# Patient Record
Sex: Male | Born: 1935 | Race: White | Hispanic: No | Marital: Married | Smoking: Former smoker
Health system: Southern US, Community
[De-identification: ages and names within clinical notes are randomized; demographics above are authoritative.]

## PROBLEM LIST (undated history)

## (undated) DIAGNOSIS — M1990 Unspecified osteoarthritis, unspecified site: Secondary | ICD-10-CM

## (undated) DIAGNOSIS — C801 Malignant (primary) neoplasm, unspecified: Secondary | ICD-10-CM

## (undated) DIAGNOSIS — F329 Major depressive disorder, single episode, unspecified: Secondary | ICD-10-CM

## (undated) DIAGNOSIS — D689 Coagulation defect, unspecified: Secondary | ICD-10-CM

## (undated) DIAGNOSIS — F32A Depression, unspecified: Secondary | ICD-10-CM

## (undated) HISTORY — DX: Unspecified osteoarthritis, unspecified site: M19.90

## (undated) HISTORY — PX: SPINE SURGERY: SHX786

## (undated) HISTORY — DX: Coagulation defect, unspecified: D68.9

## (undated) HISTORY — DX: Malignant (primary) neoplasm, unspecified: C80.1

## (undated) HISTORY — PX: PROSTATE SURGERY: SHX751

## (undated) HISTORY — DX: Major depressive disorder, single episode, unspecified: F32.9

## (undated) HISTORY — DX: Depression, unspecified: F32.A

---

## 1999-05-28 ENCOUNTER — Emergency Department (HOSPITAL_COMMUNITY): Admission: EM | Admit: 1999-05-28 | Discharge: 1999-05-28 | Payer: Self-pay | Admitting: Emergency Medicine

## 1999-05-28 ENCOUNTER — Encounter: Payer: Self-pay | Admitting: Emergency Medicine

## 2001-01-09 ENCOUNTER — Encounter: Payer: Self-pay | Admitting: Orthopedic Surgery

## 2001-01-13 ENCOUNTER — Inpatient Hospital Stay (HOSPITAL_COMMUNITY): Admission: RE | Admit: 2001-01-13 | Discharge: 2001-01-17 | Payer: Self-pay | Admitting: Orthopedic Surgery

## 2001-01-13 ENCOUNTER — Encounter: Payer: Self-pay | Admitting: Orthopedic Surgery

## 2001-03-07 ENCOUNTER — Encounter: Admission: RE | Admit: 2001-03-07 | Discharge: 2001-04-10 | Payer: Self-pay | Admitting: Orthopedic Surgery

## 2002-08-10 ENCOUNTER — Encounter: Payer: Self-pay | Admitting: Orthopedic Surgery

## 2002-08-10 ENCOUNTER — Encounter: Admission: RE | Admit: 2002-08-10 | Discharge: 2002-08-10 | Payer: Self-pay | Admitting: Orthopedic Surgery

## 2002-12-01 ENCOUNTER — Ambulatory Visit (HOSPITAL_COMMUNITY): Admission: RE | Admit: 2002-12-01 | Discharge: 2002-12-01 | Payer: Self-pay | Admitting: Gastroenterology

## 2004-03-01 ENCOUNTER — Ambulatory Visit (HOSPITAL_COMMUNITY): Admission: RE | Admit: 2004-03-01 | Discharge: 2004-03-01 | Payer: Self-pay

## 2004-04-11 ENCOUNTER — Encounter: Admission: RE | Admit: 2004-04-11 | Discharge: 2004-04-11 | Payer: Self-pay

## 2004-04-26 ENCOUNTER — Encounter: Admission: RE | Admit: 2004-04-26 | Discharge: 2004-04-26 | Payer: Self-pay

## 2004-05-17 ENCOUNTER — Encounter: Admission: RE | Admit: 2004-05-17 | Discharge: 2004-05-17 | Payer: Self-pay

## 2004-06-13 ENCOUNTER — Encounter: Admission: RE | Admit: 2004-06-13 | Discharge: 2004-06-13 | Payer: Self-pay

## 2004-06-15 ENCOUNTER — Encounter (HOSPITAL_COMMUNITY): Admission: RE | Admit: 2004-06-15 | Discharge: 2004-09-13 | Payer: Self-pay | Admitting: Urology

## 2004-07-06 ENCOUNTER — Ambulatory Visit (HOSPITAL_COMMUNITY): Admission: RE | Admit: 2004-07-06 | Discharge: 2004-07-06 | Payer: Self-pay | Admitting: Urology

## 2004-07-10 ENCOUNTER — Ambulatory Visit: Admission: RE | Admit: 2004-07-10 | Discharge: 2004-08-09 | Payer: Self-pay | Admitting: Radiation Oncology

## 2004-09-07 ENCOUNTER — Inpatient Hospital Stay (HOSPITAL_COMMUNITY): Admission: RE | Admit: 2004-09-07 | Discharge: 2004-09-08 | Payer: Self-pay | Admitting: Neurosurgery

## 2007-01-23 ENCOUNTER — Encounter: Admission: RE | Admit: 2007-01-23 | Discharge: 2007-01-23 | Payer: Self-pay | Admitting: Internal Medicine

## 2008-01-23 ENCOUNTER — Ambulatory Visit: Payer: Self-pay | Admitting: Surgery

## 2008-01-23 ENCOUNTER — Inpatient Hospital Stay (HOSPITAL_COMMUNITY): Admission: EM | Admit: 2008-01-23 | Discharge: 2008-01-29 | Payer: Self-pay | Admitting: Emergency Medicine

## 2008-01-23 ENCOUNTER — Encounter (INDEPENDENT_AMBULATORY_CARE_PROVIDER_SITE_OTHER): Payer: Self-pay | Admitting: Emergency Medicine

## 2008-01-26 ENCOUNTER — Encounter (INDEPENDENT_AMBULATORY_CARE_PROVIDER_SITE_OTHER): Payer: Self-pay | Admitting: Internal Medicine

## 2008-04-05 ENCOUNTER — Encounter: Admission: RE | Admit: 2008-04-05 | Discharge: 2008-04-05 | Payer: Self-pay | Admitting: Internal Medicine

## 2008-07-15 DIAGNOSIS — M069 Rheumatoid arthritis, unspecified: Secondary | ICD-10-CM | POA: Insufficient documentation

## 2008-07-15 DIAGNOSIS — I2699 Other pulmonary embolism without acute cor pulmonale: Secondary | ICD-10-CM

## 2008-07-15 DIAGNOSIS — F329 Major depressive disorder, single episode, unspecified: Secondary | ICD-10-CM

## 2008-07-15 DIAGNOSIS — I82409 Acute embolism and thrombosis of unspecified deep veins of unspecified lower extremity: Secondary | ICD-10-CM | POA: Insufficient documentation

## 2008-07-15 DIAGNOSIS — F3289 Other specified depressive episodes: Secondary | ICD-10-CM | POA: Insufficient documentation

## 2008-07-19 ENCOUNTER — Ambulatory Visit: Payer: Self-pay | Admitting: Cardiology

## 2008-07-19 DIAGNOSIS — R5381 Other malaise: Secondary | ICD-10-CM

## 2008-07-19 DIAGNOSIS — R42 Dizziness and giddiness: Secondary | ICD-10-CM

## 2008-07-19 DIAGNOSIS — R5383 Other fatigue: Secondary | ICD-10-CM

## 2008-07-19 DIAGNOSIS — I498 Other specified cardiac arrhythmias: Secondary | ICD-10-CM

## 2008-07-19 DIAGNOSIS — R0602 Shortness of breath: Secondary | ICD-10-CM | POA: Insufficient documentation

## 2008-07-22 ENCOUNTER — Telehealth: Payer: Self-pay | Admitting: Cardiology

## 2008-07-26 ENCOUNTER — Encounter: Payer: Self-pay | Admitting: Cardiology

## 2008-07-26 ENCOUNTER — Ambulatory Visit: Payer: Self-pay | Admitting: Cardiology

## 2008-07-26 ENCOUNTER — Ambulatory Visit: Payer: Self-pay

## 2008-07-28 ENCOUNTER — Encounter: Payer: Self-pay | Admitting: Cardiology

## 2008-08-09 ENCOUNTER — Telehealth: Payer: Self-pay | Admitting: Cardiology

## 2009-01-09 ENCOUNTER — Inpatient Hospital Stay (HOSPITAL_COMMUNITY): Admission: EM | Admit: 2009-01-09 | Discharge: 2009-01-13 | Payer: Self-pay | Admitting: Emergency Medicine

## 2009-01-09 ENCOUNTER — Ambulatory Visit: Payer: Self-pay | Admitting: Cardiology

## 2009-01-09 ENCOUNTER — Ambulatory Visit: Payer: Self-pay | Admitting: Internal Medicine

## 2009-01-10 ENCOUNTER — Encounter (INDEPENDENT_AMBULATORY_CARE_PROVIDER_SITE_OTHER): Payer: Self-pay | Admitting: Internal Medicine

## 2009-01-10 ENCOUNTER — Ambulatory Visit: Payer: Self-pay | Admitting: Vascular Surgery

## 2009-01-10 ENCOUNTER — Encounter: Payer: Self-pay | Admitting: Cardiology

## 2009-01-11 ENCOUNTER — Encounter (INDEPENDENT_AMBULATORY_CARE_PROVIDER_SITE_OTHER): Payer: Self-pay | Admitting: Internal Medicine

## 2009-01-27 ENCOUNTER — Ambulatory Visit: Payer: Self-pay | Admitting: Internal Medicine

## 2009-02-07 ENCOUNTER — Telehealth (INDEPENDENT_AMBULATORY_CARE_PROVIDER_SITE_OTHER): Payer: Self-pay | Admitting: *Deleted

## 2009-02-09 ENCOUNTER — Encounter: Payer: Self-pay | Admitting: Cardiology

## 2010-06-29 LAB — CBC
HCT: 34.5 % — ABNORMAL LOW (ref 39.0–52.0)
HCT: 35 % — ABNORMAL LOW (ref 39.0–52.0)
Hemoglobin: 10.6 g/dL — ABNORMAL LOW (ref 13.0–17.0)
Hemoglobin: 11.7 g/dL — ABNORMAL LOW (ref 13.0–17.0)
Hemoglobin: 12 g/dL — ABNORMAL LOW (ref 13.0–17.0)
MCHC: 34.2 g/dL (ref 30.0–36.0)
MCV: 95.2 fL (ref 78.0–100.0)
Platelets: 183 10*3/uL (ref 150–400)
RBC: 3.59 MIL/uL — ABNORMAL LOW (ref 4.22–5.81)
RBC: 3.67 MIL/uL — ABNORMAL LOW (ref 4.22–5.81)
RDW: 16.7 % — ABNORMAL HIGH (ref 11.5–15.5)
WBC: 9.6 10*3/uL (ref 4.0–10.5)

## 2010-06-29 LAB — GLUCOSE, CAPILLARY
Glucose-Capillary: 128 mg/dL — ABNORMAL HIGH (ref 70–99)
Glucose-Capillary: 82 mg/dL (ref 70–99)
Glucose-Capillary: 93 mg/dL (ref 70–99)

## 2010-06-29 LAB — BLOOD GAS, ARTERIAL
Drawn by: 287601
FIO2: 0.21 %
Patient temperature: 98.6
TCO2: 17.5 mmol/L (ref 0–100)
pCO2 arterial: 14.6 mmHg — CL (ref 35.0–45.0)
pCO2 arterial: 24.1 mmHg — ABNORMAL LOW (ref 35.0–45.0)
pH, Arterial: 7.457 — ABNORMAL HIGH (ref 7.350–7.450)
pH, Arterial: 7.63 (ref 7.350–7.450)

## 2010-06-29 LAB — POCT I-STAT, CHEM 8
BUN: 14 mg/dL (ref 6–23)
Calcium, Ion: 1.04 mmol/L — ABNORMAL LOW (ref 1.12–1.32)
Chloride: 110 mEq/L (ref 96–112)

## 2010-06-29 LAB — BASIC METABOLIC PANEL
CO2: 21 mEq/L (ref 19–32)
CO2: 23 mEq/L (ref 19–32)
Calcium: 8.5 mg/dL (ref 8.4–10.5)
Calcium: 8.8 mg/dL (ref 8.4–10.5)
Chloride: 112 mEq/L (ref 96–112)
Creatinine, Ser: 1.35 mg/dL (ref 0.4–1.5)
GFR calc Af Amer: >60 mL/min (ref >60)
GFR calc non Af Amer: 57 mL/min — ABNORMAL LOW (ref >60)
Glucose, Bld: 90 mg/dL (ref 70–99)
Potassium: 3.9 mEq/L (ref 3.5–5.1)
Sodium: 139 mEq/L (ref 135–145)
Sodium: 141 mEq/L (ref 135–145)

## 2010-06-29 LAB — POCT I-STAT 3, VENOUS BLOOD GAS (G3P V)
Bicarbonate: 18 mEq/L — ABNORMAL LOW (ref 20.0–24.0)
TCO2: 18 mmol/L (ref 0–100)
pH, Ven: 7.646 (ref 7.250–7.300)

## 2010-06-29 LAB — POCT CARDIAC MARKERS
CKMB, poc: 2.3 ng/mL (ref 1.0–8.0)
Troponin i, poc: <0.05 ng/mL (ref 0.00–0.09)

## 2010-06-29 LAB — COMPREHENSIVE METABOLIC PANEL
ALT: 11 U/L (ref 0–53)
Albumin: 2.8 g/dL — ABNORMAL LOW (ref 3.5–5.2)
Alkaline Phosphatase: 47 U/L (ref 39–117)
Calcium: 8.8 mg/dL (ref 8.4–10.5)
GFR calc Af Amer: >60 mL/min (ref >60)
Glucose, Bld: 96 mg/dL (ref 70–99)
Potassium: 3.8 mEq/L (ref 3.5–5.1)
Sodium: 141 mEq/L (ref 135–145)
Total Protein: 5.2 g/dL — ABNORMAL LOW (ref 6.0–8.3)

## 2010-06-29 LAB — DIFFERENTIAL
Eosinophils Absolute: 0.2 10*3/uL (ref 0.0–0.7)
Eosinophils Relative: 2 % (ref 0–5)
Lymphs Abs: 1.7 10*3/uL (ref 0.7–4.0)
Monocytes Absolute: 0.9 10*3/uL (ref 0.1–1.0)
Monocytes Relative: 9 % (ref 3–12)
Neutrophils Relative %: 71 % (ref 43–77)

## 2010-06-29 LAB — IRON AND TIBC
Saturation Ratios: 15 % — ABNORMAL LOW (ref 20–55)
TIBC: 276 ug/dL (ref 215–435)

## 2010-06-29 LAB — HEPARIN LEVEL (UNFRACTIONATED): Heparin Unfractionated: <0.1 IU/mL — ABNORMAL LOW (ref 0.30–0.70)

## 2010-06-29 LAB — CARDIAC PANEL(CRET KIN+CKTOT+MB+TROPI)
CK, MB: 3 ng/mL (ref 0.3–4.0)
CK, MB: 3.4 ng/mL (ref 0.3–4.0)
Relative Index: 2.7 — ABNORMAL HIGH (ref 0.0–2.5)
Relative Index: 2.8 — ABNORMAL HIGH (ref 0.0–2.5)
Relative Index: 2.9 — ABNORMAL HIGH (ref 0.0–2.5)
Total CK: 104 U/L (ref 7–232)
Total CK: 105 U/L (ref 7–232)
Troponin I: 0.01 ng/mL (ref 0.00–0.06)
Troponin I: 0.02 ng/mL (ref 0.00–0.06)
Troponin I: 0.02 ng/mL (ref 0.00–0.06)

## 2010-06-29 LAB — POCT I-STAT 3, ART BLOOD GAS (G3+)
O2 Saturation: 99 %
pCO2 arterial: 14.1 mmHg — CL (ref 35.0–45.0)
pCO2 arterial: 14.2 mmHg — CL (ref 35.0–45.0)
pH, Arterial: 7.607 (ref 7.350–7.450)
pO2, Arterial: 116 mmHg — ABNORMAL HIGH (ref 80.0–100.0)
pO2, Arterial: 119 mmHg — ABNORMAL HIGH (ref 80.0–100.0)

## 2010-06-29 LAB — PROTIME-INR
INR: 2.31 — ABNORMAL HIGH (ref 0.00–1.49)
INR: 2.33 — ABNORMAL HIGH (ref 0.00–1.49)
INR: 2.81 — ABNORMAL HIGH (ref 0.00–1.49)
Prothrombin Time: 29.4 seconds — ABNORMAL HIGH (ref 11.6–15.2)

## 2010-06-29 LAB — FERRITIN: Ferritin: 23 ng/mL (ref 22–322)

## 2010-06-29 LAB — FOLATE: Folate: >20 ng/mL

## 2010-07-24 ENCOUNTER — Other Ambulatory Visit: Payer: Self-pay | Admitting: Dermatology

## 2010-08-07 ENCOUNTER — Other Ambulatory Visit: Payer: Self-pay | Admitting: Dermatology

## 2010-08-08 NOTE — Consult Note (Signed)
NAME:  Miguel [name], Miguel [name] NO.:  [id number]   MEDICAL RECORD NO.:  [id number]          PATIENT TYPE:  INP   LOCATION:  2025                         FACILITY:  [facility]   PHYSICIAN:  Antonietta Breach, M.D.  DATE OF BIRTH:  Nov 10, 1931   DATE OF CONSULTATION:  01/28/2008  DATE OF DISCHARGE:                                 CONSULTATION   REQUESTING PHYSICIAN:  [employer].   REASON FOR CONSULTATION:  Severe anxiety and depression.   HISTORY OF PRESENT ILLNESS:  Miguel [name] is a 77 year old male  admitted to the [facility] on January 06, 2008, due to pulmonary  embolism.   Miguel [name] reentered a major depression episode starting about 2  months ago.  He recalls that he was mowing the lawn, began to have  shortness of breath and began to have catastrophic thinking about his  likelihood of suddenly becoming physically disabled.  Followed by this,  he began to have very depressed mood and then redeveloped low energy,  poor concentration, anhedonia and thoughts of hopelessness.  As  mentioned, this has been occurring for several weeks.  He has not been  having any suicidal thoughts.  He has no hallucinations or delusions.   He does continue with regular catastrophic thoughts about the future  with severe excessive worry, feeling on edge and muscle tension.   His general medical care has not resulted in any reduction of these  symptoms.   He has been switching psychotropic medications with his psychiatrist.  Most recently, they started Parnate; however, the Parnate was  discontinued by his psychiatrist when he entered the hospital to reduce  the risk of hypertensive crisis given the possibility of dietary factors  and other factors.   PAST PSYCHIATRIC HISTORY:  Miguel [name] does have an extensive past  psychiatric history.   In review of the past medical record at [facility], only Xanax is  mentioned.  This was in 2002, for anxiety.   Taking the  history from the patient who is a reliable historian along  with his wife (the patient requests that his wife remain in the room to  help with history, facilitation of support and education).   Miguel [name] has undergone at least two courses of electroconvulsive  therapy.  The first one was in 1998.  He also had another one 7 years  ago with 13 treatments.  He has been under the care of Dr. Jennelle Human.   FAMILY PSYCHIATRIC HISTORY:  None known.   SOCIAL HISTORY:  Miguel [name] is retired.  He lives with his  supportive wife.  He does not do alcohol or illegal drugs.   PAST MEDICAL HISTORY:  1. Rheumatoid arthritis.  2. History of prostate cancer with resection.  3. Left hip replacement.   MEDICATIONS:  The MAR is reviewed;  1. Folic acid 1 mg daily.  2. Multivitamin daily.  3. Xanax 0.25-0.75 mg p.o. q.6 h., p.r.n.   ALLERGIES:  IBUPROFEN.   LABORATORY DATA:  Sodium 142, BUN 10, creatinine 1.31, glucose 102, WBC  6.1, hemoglobin 9.7, platelet count 196, INR 2.6, TSH 1.448.   REVIEW  OF SYSTEMS:  PSYCHIATRIC:  Miguel [name] has received Xanax 0.5  and another dosage of Xanax 0.25 over the past 24 hours.   He has been tried on multiple psychotropics in the past.  Lithium has  been tried.  He and his wife do not recall T3.   Miguel [name] does not have any history of decreased need for sleep or  increased energy.  He has no history of suicide attempts.  He has  undergone multiple psychiatric hospitalizations.   He has had a trial on Nardil.  His wife states there was never a  hypertensive crisis with it; however, the patient states that he had one  in a restaurant one time.  The Nardil did work; it was discontinued for  an unknown reason.   He does have a history of Zyprexa trial.  Cymbalta was associated with  efficacy for 3 months, and then he had the recurrence of depression  mentioned in the history of present illness.   Cymbalta with Remeron has been tried.  The  Remeron was only increased to  7.5 mg nightly due to sedation.  He also has been tried on Abilify with  Cymbalta.  They are not sure if he has been on a combination of Effexor  with Remeron.   They do not recall Lamictal or a stimulation agent such as Dexedrine or  Ritalin.  They do not recall any episode of utilizing an SSRI, such as  Celexa at 75-100% of the maximum daily dosage for at least 16 weeks.  They are not sure if he has been on Seroquel.  They do recall a history  of Wellbutrin.  They are not sure if it is has been combined with other  medications.   The patient has an extreme resistance toward repeating electroconvulsive  therapy.   He has no history of suicidal ideation, homicidal ideation or  hallucinations.   Constitutional, head, eyes, ears, nose, throat, mouth, neurologic,  cardiovascular, respiratory, gastrointestinal, genitourinary, skin,  musculoskeletal, hematologic, lymphatic, endocrine and metabolic all  unremarkable.   PHYSICAL EXAMINATION:  VITAL SIGNS:  Temperature 98.0, pulse 54,  respiratory rate 20, blood pressure 110/69.  GENERAL APPEARANCE:  Miguel [name] is as an elderly male lying in a  supine position in his hospital bed with no abnormal involuntary  movements.   MENTAL STATUS EXAM:  Miguel [name] demonstrates intermittent eye  contact, preferring to close his eyes.  His attention span is slightly  decreased.  Concentration is moderately decreased.  Affect is very  anxious, mood is anxious.  Orientation is completely intact to all  spheres.  Memory is intact to immediate, recent and remote.  Fund of  knowledge and intelligence are within normal limits.  Speech involves a  slightly flat prosody, no dysarthria.  Thought process is logical,  coherent and goal-directed.  No looseness of associations.  Thought  content; no thoughts of harming himself, no thoughts of harming others,  no delusions, no hallucinations.  Insight is partially intact.   Judgment  is intact.   ASSESSMENT:  AXIS I:  1. 293.83, mood disorder, not otherwise specified, likely 296.33,      major depressive disorder, recurrent, severe.  2. 293.84, anxiety disorder, not otherwise specified.  There are some      obsessive features; however, this does appear to be generalized      anxiety disorder with severe catastrophic worry.  The patient and      his wife believe that his anxiety has  been primary in the sense      that his last episode seemed to start with catastrophic thinking      about his somatic symptoms and the future, followed by progression      of depression.  AXIS II:  Deferred.  AXIS III:  See past medical history.  AXIS IV:  General medical.  AXIS V:  Global Assessment of Functioning 55.   Miguel [name] is not at risk to harm himself or others.  He does agree  to call emergency services immediately for any thoughts of harming  himself, thoughts of harming others or distress.   The undersigned provided ego supportive psychotherapy and education with  both the patient and his wife.   The undersigned will contact Dr. Jennelle Human regarding previous psychotropic  trials and the possibilities of future trials.   The indications, alternatives and adverse effects of anti-acute anxiety  agents were reviewed.  They understand and want to proceed with Xanax  for now.   Would consider a standing dose of Xanax 0.5 mg t.i.d. due to the  patient's intermittent use requiring p.r.n.'s with the pattern of taking  a p.r.n. and a calming of his anxiety, followed by a re-exacerbation of  his anxiety in between the dosages.  It is possible for him to shape a  pattern of aberrant conditioning with the Xanax that can promote an  addictive pattern.   Therefore, when it is demonstrated that he require steady Xanax, it is  more optimal to place him on a low standing dose until other tools can  become effective with the taper off of Xanax following.   Other tools  for anti-anxiety specifically can involve a titration of  Celexa to 60 mg daily for [redacted] weeks along with cognitive behavioral  therapy, combined with deep breathing and progressive muscle relaxation.  Both the patient and his wife do not recall any cognitive behavioral  therapy.   Specifically for the anti-depression component, combinations of Lamictal  with other primary antidepressants can be considered   Retrying Effexor combined with Remeron can be considered combined with  Lamictal.   Other possibilities include an acute stimulant; however, this might  exacerbate the anxiety component.   When utilizing the Xanax acutely, would monitor for excessive sedation  and potentially imbalance.      Antonietta Breach, M.D.  Electronically Signed     JW/MEDQ  D:  01/29/2008  T:  01/29/2008  Job:  [id number]

## 2010-08-08 NOTE — Discharge Summary (Signed)
NAME:  Miguel [name], Miguel [name] NO.:  [id number]   MEDICAL RECORD NO.:  [id number]          PATIENT TYPE:  INP   LOCATION:  2025                         FACILITY:  [facility]   PHYSICIAN:  Monte Fantasia, MD  DATE OF BIRTH:  09/30/1931   DATE OF ADMISSION:  01/23/2008  DATE OF DISCHARGE:  01/29/2008                               DISCHARGE SUMMARY   ADDENDUM   DISCHARGE DIAGNOSES:  1. Bilateral pulmonary embolism.  2. Left leg deep venous thrombosis.  3. Depression.  4. Rheumatoid arthritis.   DISCHARGE MEDICATIONS:  1. Prednisone 2.5 mg p.o. daily.  2. Folic acid 1 mg p.o. daily.  3. Methotrexate 10 p.o. every Friday at 6 p.m.  4. Protonix 40 mg p.o. q.12 hours.  5. Os-Cal 1 tablet p.o. twice daily.  6. Multivitamin 1 tablet p.o. daily.  7. Alprazolam 0.75 mg p.o. q.6 hours p.r.n. anxiety.  8. Vitamin D 2000 units daily.  9. Coumadin 3 mg p.o. nightly.  10.Ensure 237 mL p.o. t.i.d.  11.Senna 1 tablet p.o. nightly.   PHYSICAL EXAMINATION:  VITAL SIGNS:  Temperature 99.8, pulse of 53,  respirations 18, blood pressure 129/59, and oxygen saturations 96% on  room air.  HEENT:  Neck is supple.  Pupils equal and reacting to light.  No pallor.  No lymphadenopathy.  RESPIRATORY:  Air entry is bilaterally equal.  No rales.  No rhonchi.  CARDIOVASCULAR:  S1 and S2 normal.  Regular rate and rhythm.  ABDOMEN:  Soft, no organomegaly and no distention.  No tenderness.  No  guarding.  No rigidity.  EXTREMITIES:  No edema of feet.  CNS:  The patient is alert, awake, oriented x3.  No focal neurological  deficits.   LABORATORY DATA:  WBC 5.2, hemoglobin 9.5, hematocrit 26.9, and  platelets 231.  Prothrombin time is 28.6, INR is 2.5, sodium 141,  potassium 3.9, chloride 112, bicarb 22, glucose is 93, BUN is 8,  creatinine 1.15, calcium is 8.6, TSH 1.448.   ASSESSMENT AND PLAN:  To discharge the patient on Coumadin 3 mg p.o.  nightly.  The patient visiting nurses, arranged for  daily INR checks and  to inform the INR values to the primary care physician.  Home care  services have been set up as per discussions with the home care.  We  will continue the medications as per dictated above.  The patient needs  to follow up with the primary care physician Dr. Georgianne Fick and  psychiatrist Dr. Meredith Staggers in view of his depression and further issues.  To follow up with  the primary care physician within a week.  Monitor INR daily and to  inform the INR values to the primary care physician.   DVT and GI prophylaxis given.      Monte Fantasia, MD  Electronically Signed     MP/MEDQ  D:  01/29/2008  T:  01/30/2008  Job:  [id number]

## 2010-08-08 NOTE — H&P (Signed)
NAME:  Miguel [name], Miguel [name] NO.:  [id number]   MEDICAL RECORD NO.:  [id number]          PATIENT TYPE:  INP   LOCATION:  2025                         FACILITY:  [facility]   PHYSICIAN:  Estelle Grumbles, MDDATE OF BIRTH:  [birth year]   DATE OF ADMISSION:  01/23/2008  DATE OF DISCHARGE:                              HISTORY & PHYSICAL   PRIMARY CARE PHYSICIAN:  Georgianne Fick, M.D.   PSYCHIATRIST:  Meredith Staggers, M.D.   CHIEF COMPLAINT:  Chest pain.   HISTORY OF PRESENT ILLNESS:  Miguel [name] is 77 year old male  presented to the emergency room with left-sided chest pain.  The patient  states he woke up yesterday morning with left lower rib cage chest pain  worse with deep breaths.  The pain was persistent throughout the day,  worse with deep inspiration.  He took a pain medication last night with  some relief.  Last night, he woke up with chest pain.  He was also  having some dyspnea.  As the pain was persistent he decided to come to  the emergency room.  In the ED, the patient underwent CT of the chest  which revealed bilateral pulmonary embolism.  The patient states now the  chest pain is little better, at worse the pain was 10/10.  Currently, he  would say like 3-4/10.  Denies any nausea, vomiting, palpitations, or  diaphoresis.  Denies having any prior episodes of blood loss.  Denies  having any recent fever.   REVIEW OF SYSTEMS:  The patient denies having any dysphagia, dysarthria.  No visual or auditory symptoms.  Denies having any neck pain.  He does  have chest pain as mentioned earlier.  Denies having any muscle spasms.  Initially, he thought the chest pain is muscular.  Denies having any  orthopnea or paroxysmal nocturnal dyspnea.  He has been feeling  depressed.  In the recent past, he was following with a psychiatrist  regularly and his medications has been getting changed.  Recently, he  was started on Parnate MAO inhibitor.  He started taking that  medicine 3  days ago.  The patient denies having any nausea, vomiting, diarrhea, or  constipation.  No change in his bowel habits in the recent past.  He  denies having any hematemesis, melena, or hemoptysis.  He does have  lower back pain, which is chronic.  He denies having any lower extremity  swelling, tingling, numbness, or calf pain.  He denies having any  hematuria.  He denies having any dysuria, frequency, or urgency.  Other  review of systems are negative.   PAST MEDICAL HISTORY:  1. Depression.  2. Rheumatoid arthritis.   SURGICAL HISTORY:  1. He had history of prostate cancer and had prostatectomy done.  2. He had bilateral hip replacement and he had a spinal procedure done      for his chronic low back pain.   SOCIAL HISTORY:  He denies smoking, alcohol, or drug use.  He used to  smoke approximately 1 pack of cigarettes a day.  He quit smoking  approximately 40 years ago.  Lives with his wife.  Denies  having any  recent falls.   FAMILY HISTORY:  History of cancer runs in the family.  His sister has  thyroid cancer.   HOME MEDICATIONS:  1. He was taking prednisone 2.5 mg daily.  2. Folic acid 1 mg daily.  3. Calcium twice a day.  4. Multivitamin 1 tablet daily.  5. Vitamin D 2000 International Units once a day.  6. Alprazolam 0.75 mg as needed.  7. Methotrexate 2.5 mg.   ALLERGIES:  DAYPRO and IBUPROFEN.   PHYSICAL EXAMINATION:  GENERAL:  The patient is alert, awake, oriented  x4 not in any acute distress.  VITAL SIGNS:  Temperature 97.1, blood pressure 103/54, respirations 16,  O2 saturation 100% on oxygen.  HEENT:  Head, normocephalic and atraumatic.  Eyes; pupils equal,  reacting to light and accommodation.  No icterus was noted.  Oral  cavity, moist oral mucosa.  NECK:  Supple.  No JVD was noted.  CHEST:  Bilateral fair air entry.  No crackles or rales.  Does have some  tenderness in the left lower rib cage area.  HEART:  S1 and S2.  Regular rate and  rhythm.  No murmurs were noted.  ABDOMEN:  Soft.  Bowel sounds are positive.  Nontender.  Nondistended.  No guarding or rigidity.  EXTREMITIES:  No edema.  No calf tenderness.  Good peripheral pulses.  CNS:  No focal, motor, or neuro deficits.   LABORATORY DATA:  Sodium 138, potassium 3.9, chloride 107, bicarb 21,  BUN 20, creatinine 1.4, glucose 92, and calcium level 1.29.  CK 136 and  MB fraction 1.7.  Troponin less than 0.05.  INR 1.1.  White count 9.9,  hemoglobin 10.7, hematocrit 31.7, and platelets 176.  Liver function  tests are essentially normal except albumin level 3.1, which is lower  than normal.  BNP is 77.  Chest x-ray, low lung volumes.  No acute  process.  CT of the chest reveals bilateral pulmonary embolism.   IMPRESSION:  Acute pulmonary embolism resulting pleuritic chest pain.  We will admit the patient to the Telemetry Unit.  We will start IV  heparin drip and p.o. Coumadin.  Coumadin dose to be adjusted by the  Pharmacy.  We will follow up the PT and INR daily.  We will obtain the  CBC in the morning.  Parnate was discontinued as per the patient's  psychiatrist recommendation.  We will continue his other home  medications.  The patient's hemoglobin and hematocrit needed to be  followed closely and his mental status also has to be followed closely.  The patient was recently diagnosed with severe depression.  We will  obtain the pulse ox at room air.  We will also obtain the lower  extremities Doppler to rule out DVT.      Estelle Grumbles, MD  Electronically Signed     TP/MEDQ  D:  01/23/2008  T:  01/24/2008  Job:  [id number]   cc:   Georgianne Fick, M.D.  Jetty Duhamel., M.D.

## 2010-08-08 NOTE — Discharge Summary (Signed)
NAME:  Miguel [name], Miguel [name] NO.:  [id number]   MEDICAL RECORD NO.:  [id number]          PATIENT TYPE:  INP   LOCATION:  2025                         FACILITY:  [facility]   PHYSICIAN:  Monte Fantasia, MD  DATE OF BIRTH:  January 10, 1932   DATE OF ADMISSION:  01/23/2008  DATE OF DISCHARGE:                               DISCHARGE SUMMARY   A 77 year old Caucasian male patient was admitted on January 23, 2008,  with bilateral pulmonary embolism.  The patient was initially started on  heparin drip and then started on Coumadin p.o.  The patient's present  INR has improved to currently 2.5.  The patient is on 3 mg of Coumadin  everyday.  The patient denies of any complaints of chest pain or  shortness of breath, basically has improved from his baseline pain.   DISCHARGE MEDICATIONS:  1. Prednisone 2.5 mg p.o. daily.  2. Folic acid 1 mg p.o. daily.  3. Methotrexate 10 mg p.o. Friday at 6 p.m.  4. Protonix 40 mg p.o. q.12 h.  5. Os-Cal 1 tablet twice daily.  6. Multivitamin 1 tablet daily.  7. Alprazolam 0.75 mg q.6 h. p.r.n. anxiety.  8. Vitamin D 2000 IU daily.  9. Coumadin 3 mg p.o. at bedtime.  10.Ensure 237 mL p.o. t.i.d.  11.Senna 1 tab p.o. at bedtime.   PHYSICAL EXAMINATION:  VITAL SIGNS:  Temperature 97, pulse 71,  respirations 18, blood pressure 94/57, oxygen saturations 97% on room  air.   LABORATORY DATA:  Total WBC 6.1, H and H 9.7 over 28, hematocrit of  28.9, platelet 196.  Prothrombin time 29.6, INR is 2.6.  Sodium 142,  potassium 4.7, chloride 109, bicarb is 26, glucose is 102, BUN is 10,  creatinine is 1.3, calcium is 9.   Diagnostic tests since admission:  CT angiography done on January 23, 2008, positive for bilateral pulmonary embolism, clot burden is moderate  in size, scattered areas of atelectasis, cannot exclude early areas of  infarction.  Chest x-ray, low lung volumes without any focal disease.  MRI of the brain done with and without contrast.   No evidence of acute  reversible process.  The patient shows a degree of brain atrophy.  There  is minor chronic small vessel changes in the hemispheric white matter.  The venous Dopplers of both lower extremities, right no evidence of DVT  or superficial thrombosis, left DVT noted from proximal common femoral  vein through the calf vein including the gastrocnemius vein.  No  superficial thrombosis noted.  Nonocclusive common femoral vein and  proximal femoral vein, DVT.   ASSESSMENT AND PLAN:  1. Bilateral pulmonary embolism.  2. Left leg deep venous thrombosis.  3. Depression.  4. Rheumatoid arthritis.   PLAN:  1. To discharge the patient.  The patient's INR at present is      therapeutic to 2.5.  The patient had a PT evaluation done.  He will      have a home care evaluation done tomorrow in view of possible need      for the home care as per the family request.  The  patient also will      need to be following up in Coumadin Clinic.  2. For rheumatoid arthritis, the patient is on methotrexate and      prednisone.  We will continue the same as per treatment.  3. For depression, the patient needs to follow up with his      psychiatrist, Dr. Meredith Staggers as an outpatient in view of his      depression.   DVT and GI prophylaxis given.   PRIMARY CARE PHYSICIAN:  Georgianne Fick, MD      Monte Fantasia, MD  Electronically Signed     MP/MEDQ  D:  01/28/2008  T:  01/29/2008  Job:  [id number]   cc:   Georgianne Fick, M.D.

## 2010-08-11 NOTE — Op Note (Signed)
NAME:  Miguel [name], LAMPERT NO.:  [id number]   MEDICAL RECORD NO.:  [id number]          PATIENT TYPE:  INP   LOCATION:  2899                         FACILITY:  [facility]   PHYSICIAN:  Hewitt Shorts, M.D.DATE OF BIRTH:  08/23/1931   DATE OF PROCEDURE:  09/07/2004  DATE OF DISCHARGE:                                 OPERATIVE REPORT   PREOPERATIVE DIAGNOSIS:  Lumbar stenosis, degenerative dynamic  spondylolisthesis at L4-5, lumbar spondylosis and neurogenic claudication.   POSTOPERATIVE DIAGNOSIS:  Lumbar stenosis, degenerative dynamic  spondylolisthesis at L4-5, lumbar spondylosis and neurogenic claudication.   PROCEDURE:  The patient is a 77 year old man who presented with neurogenic  claudication.  He was found to have lumbar stenosis at L4-5 worse than L3-4  with a dynamic degenerative spondylolisthesis at the L4-5 level.  The  decision was made to proceed with decompression with implantation of X-stop  devices at L3-4 and L4-5.   DESCRIPTION OF PROCEDURE:  The patient was brought to the operating room and  placed under general endotracheal anesthesia.  The patient was turned to a  prone position.  Lumbar region was prepped with DuraPrep and draped in a  sterile fashion.  The C-arm fluoroscopic unit was used to localize  throughout the procedure.  We localized the L3-4 and L4-5 interspinous  spaces and the midline was infiltrated with local anesthetic with  epinephrine and then a midline incision made over the L3-4 and L4-5 levels  and dissection was carried down to the subcutaneous tissue.  Bipolar cautery  and electrocautery was used to maintain hemostasis.  Dissection was carried  down to the lumbar fascia which was incised bilaterally and the paraspinal  muscle was dissected from the spinous processes in a subperiosteal fashion.  We identified the L4-5 interspinous space and first used the small dilator  and subsequently followed that with the large dilator.   We then used the  distractor which we were able to distract up to 11 mm and therefore selected  an 8 mm implant.  The spacer assembly was passed from the right side and  then the wing assembly was attached to it with the set screw.  The two  assemblies were compressed together and then the set screw was tightened  with a hex torque screwdriver against the handle for the spacer assembly  being used as counter torque.  Once that implant was in place, we then  identified the L3-4 interspinous space, again used the small dilator  followed by the large dilator and then the distractor.  We were able to  distract to 12 mm and therefore selected a 10 mm implant.  The spacer  assembly was passed from the right side and then the wing assembly was  attached to it with set screw.  The spacer and wing assemblies were then  compressed together and then the set screw tightened with a hex torque  screwdriver with the handle of the spacer assembly being used as a counter  torque.  Once both implants were in place, an AP view was taken as well and  the spacers were seen to be in  good position.  We then proceeded with  closure.  The deep fascia was closed with interrupted undyed 1 Vicryl  suture, the deep subcutaneous layer was closed with interrupted inverted  undyed 1 Vicryl sutures, the subcutaneous and subcuticular layer were closed  with interrupted inverted 2-0 undyed Vicryl sutures, and the skin edges were  approximated with Dermabond.  The procedure was tolerated well.  The estimated blood loss was 25 mL.  Sponge count was correct.  Following this the patient was turned back to  supine position to be reversed from anesthetic, extubated, and transferred  to the recovery room for further care.       RWN/MEDQ  D:  09/07/2004  T:  09/07/2004  Job:  [id number]

## 2010-08-11 NOTE — Op Note (Signed)
[place]. [facility]  Patient:    Miguel [name], Miguel [name] Visit Number: [id number] MRN: [id number]          Service Type: SUR Location: [id number] Attending Physician:  Georgena Spurling Dictated by:   Georgena Spurling, M.D. Proc. Date: 01/13/01 Admit Date:  01/13/2001                             Operative Report  PREOPERATIVE DIAGNOSES:  Left hip osteoarthritis.  POSTOPERATIVE DIAGNOSES:  Left hip osteoarthritis.  OPERATIVE PROCEDURE:  Left total hip arthroplasty.  SURGEON:  Georgena Spurling, M.D.  ASSISTANT:  Jamelle Rushing, P.A.  ANESTHESIA:  General endotracheal anesthesia.  COUNTS:  Correct.  INDICATIONS:  The patient is a 77 year old with failure of conservative treatment for osteoarthritis of the left hip. Informed consent was obtained and preoperative medical clearance was obtained.  DESCRIPTION OF PROCEDURE:  The patient was laid supine and administered general endotracheal anesthesia and then a Foley catheter was placed. He was then placed in the right lateral decubitus position and the left hip was prepped and draped in the usual sterile fashion. A curvilinear incision was made through the skin with a #10 blade and then cautery dissection was performed down to the tensor fascia lata. The tensor was incised along the length of the incision with cautery. We then placed a Charnley retractor in place to perform the bursectomy. We then developed an anterior sleeve with a direct lateral approach. We lifted off the anterior 1/3 of the gluteus medius and vastus lateralis in continuity and also the gluteus minimus. We tagged these with stay sutures and then excised the anterior hip joint capsule. We then externally rotated and flexed the hip, dislocating the hip. We then used a cutting guide to mark our cut on the neck using a sagittal saw to make the cut. We removed the femoral head and then placed the leg on the table in external rotation with a  Hohmann retractor retracting it posteriorly and another Hohmann retracting the soft tissue structures anteriorly. We excised the labrum 360 degrees around and removed the ligamentum. At this point we had excellent visualization of the acetabulum and it was sequentially reamed up to a size #50 and put in a #52 no-hole, no-spike press-fit cup. This allowed 2 mm of press-fit fixation. At this point we flexed the leg and externally rotated it into a pouch on the front of the table and gained access to our femoral canal. We then reamed up to 12.5 and then broached up to a 13 and placed down a size #13 stem in approximately 5-10 degrees of anteversion. At this point we did trial with the broach and a #0 head and a 7 mm off-set acetabular neutral liner. These worked well and were very, very stable, so we then irrigated copiously, tapped in the real 7 mm neutral cup, tamped down the size 13 fully porous coated stem and tamped on the #0 x 32 mm diameter head. We then located the hip; leg lengths were good; range of motion was excellent; stability was excellent. We placed a medium Hemovac deep into the joint coming out anteriorly and distally. We then closed the vastus lateralis abductor sleeve through drill holes with Ethibond sutures. We then closed the tensor fascia lata with interrupted #1 Vicryl sutures, the deep soft tissues with interrupted #0 Vicryl sutures, subcuticular running 2-0 Vicryl, and then skin staples. We dressed with Adaptic, 4  x 4s, sterile ABDs and a sterile Ioban drape. The patient tolerated the procedure well.  COMPLICATIONS:  None.  DRAINS:   1 Hemovac.  ESTIMATED BLOOD LOSS:  Approximately 500 cc. Dictated by:   Georgena Spurling, M.D. Attending Physician:  Georgena Spurling DD:  01/13/01 TD:  01/14/01 Job: [id number]

## 2010-08-11 NOTE — H&P (Signed)
[place]. [facility]  Patient:    Miguel [name], Miguel [name]. Visit Number: [id number] MRN: [id number]          Service Type: Attending:  Mila Homer. Sherlean Foot, M.D. Dictated by:   Arnoldo Morale, P.A. Adm. Date:  01/13/01                           History and Physical  DATE OF BIRTH: [birth year]  CHIEF COMPLAINT: Progressively worsening left hip pain for the last year.  HISTORY OF PRESENT ILLNESS: This 77 year old white male patient presents to Dr. Sherlean Foot with a history of being diagnosed with rheumatoid arthritis in 1996. Approximately one year ago he had a gradual onset of progressively worsening left hip pain.  The pain has gotten much, much worse over the last several months.  He has had no known injury or prior surgery to his left hip.  He reports the pain is pretty much constant and describes anywhere from an ache to real sharp at times.  He really feels the pain mostly in his left knee and distal tibia, with occasional pain on the lateral aspect of his leg.  The pain increases with any walking or standing and decreases with rest and heat.  He has no night pain, paresthesias, or back pain.  He has not tried any cortisone shots in the hip.  He is currently taking Vicodin for pain, and that provides a fair amount of relief.  He does complain of some leg cramps at times, and he does not walk with any assistive devices.  He does have a significant limp.  ALLERGIES:  1. DAYPRO caused acute renal failure.  2. IBUPROFEN caused edema of his lips.  CURRENT MEDICATIONS:  1. Prednisone 2.5 mg one tablet p.o. q.d.  2. Folic acid 1 mg one tablet p.o. q.p.m.  3. Ferrous sulfate 365 mg one tablet p.o. b.i.d.  4. Methotrexate 2.5 mg four tablets p.o. q.Wednesday.  5. Xanax 0.5 mg 1-1/2 tablets p.o. q.h.s.  6. Vicodin 5/500 mg one to two tablets p.o. q.4h p.r.n. for pain.  PAST MEDICAL HISTORY:  1. He was diagnosed with rheumatoid arthritis in 1996.  He is followed by  Dr.     Phylliss Bob for that.  2. He does have adenocarcinoma of the prostate, which was treated with     radical retropubic prostatectomy in 1996 by Dr. Patsi Sears.  3. He does have a history of depression and panic attacks after he was     diagnosed with prostate cancer and he has just recently been taken off     MAOI inhibitors, and that was done about two weeks.  Dr. Jennelle Human is his     psychiatric doctor.  He denies any history of diabetes mellitus, hypertension, thyroid disease, hiatal hernia, peptic ulcer disease, heart disease, asthma, or any other chronic medical condition other than noted previously.  PAST SURGICAL HISTORY:  1. Radical retropubic prostatectomy in May 1996 by Dr. Jethro Bolus.  2. Tonsillectomy at age 62.  3. Laparoscopic cholecystectomy in the mid 1980s by Dr. Rolene Course.  SOCIAL HISTORY: He denies any history of cigarette smoking, alcohol use, or drug use.  He is married and has three children.  He and his wife live in a two-story house with 14 steps into the main entrance.  His bedroom is on the second floor.  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  MEDICAL DOCTOR: Dr.  Rowe, phone number [phone].  He has been Dr. Kriste Basque in the past but has not seen him in many years.  FAMILY HISTORY: His mother is alive at age 39 and has a history of angina. His father died at the age of 37 with Raynauds disease, history of a stroke, congestive heart failure, and myocardial infarction.  He had one brother who passed away at age 16 with problems with stroke after an accident while dealing with lumber.  He has one sister alive at age 61 who has cancer of the thyroid.  His children are age 12, 94, and his son is 67.  They are all alive and healthy.  REVIEW OF SYSTEMS: He reports on one examination he was found to have a mildly enlargement heart and that has been evaluated with two echocardiograms, and all that is fine.  He does have  rheumatoid arthritis and it effects mostly his hands and his hip, and he complains of just generalized stiffness - especially first thing in the morning.  He does wear glasses.  He complains of some tingling on the plantar surface of his right foot under his toes at times, and this comes and goes.  He does have a Living Will and his power of attorney is Ms. Particia Lather.  All other systems are negative and noncontributory.  PHYSICAL EXAMINATION:  GENERAL: Well-developed, well-nourished white male in no acute distress. Walks with a significant limp on the left and antalgic gait.  Mood and affect are appropriate.  Talks easily with the examiner.  VITAL SIGNS: Height 5 feet 8 inches.  Weight 200 pounds.  BMI 29.5.  TEMP 97.4 degrees Fahrenheit, pulse 68, respirations 24, and BP 128/72.  HEENT: Normocephalic, atraumatic, without frontal or maxillary sinus tenderness to palpation.  Conjunctivae pink.  Sclerae anicteric.  PERRLA. EOMI.  No visible external ear deformities.  Hearing grossly intact.  Tympanic membranes pearly gray bilaterally with good light reflex.  Nose and nasal septum midline.  Nasal mucosa pink and moist without exudate or polyp noted. Buccal mucosa pink and moist.  Good dentition.  Pharynx without erythema or exudate.  Tongue and uvula are midline.  NECK: No visible masses or lesions noted.  Trachea midline.   No palpable lymphadenopathy or thyromegaly.  Carotids +2 bilaterally without bruits.  Full range of motion and nontender to palpation along the cervical spine.  CARDIOVASCULAR: Heart rate and rhythm are regular.  S1 and S2 present without rubs, clicks, or murmurs noted.  RESPIRATORY: Respirations even and unlabored.  Breath sounds clear to auscultation bilaterally without rales or wheezes noted.  ABDOMEN: Rounded abdominal contour.  Bowel sounds present x 4 quadrants.  He has a well-healed midline incision in his low abdominal area.  Nontender  to palpation.  No hepatosplenomegaly or CVA tenderness.  Nontender to palpation  along the entire length of the vertebral column.  Femoral pulses are +2 bilaterally.  BREAST/GU/RECTAL: These examinations are deferred at this time.  MUSCULOSKELETAL: No obvious deformities.  Bilateral upper extremities with full range of motion without pain.  Radial pulses are +2 bilaterally.  Full range of motion of knees, ankles, and toes bilaterally.  DP and PT pulses are +2.  He has no real tenderness to palpation in either groin or over the greater trochanters.  He has full range of motion of his right hip without pain.  He does have some minimal crepitus with range of motion of the right knee.  Internal and external rotation of the right hip is  easy and painless. Left hip has full extension and flexion only to about 100 degrees.  He has absolutely no internal or external rotation and abduction of about 30 degrees. Any range of motion of the left hip does cause pain.  NEUROLOGIC: Alert and oriented x 3.  Cranial nerves 2-12 grossly intact. Strength 5/5 in bilateral upper and lower extremities.  Rapid alternating movements intact.  Deep tendon reflexes 2+ in bilateral upper and lower extremities.  Sensation intact to light touch.  RADIOLOGIC FINDINGS: X-rays taken of his left hip in April 2002 show no joint space at all in that left hip, with severe arthritic changes.  X-rays taken of his left knee in August 2002 show left knee with mild to moderate arthritis with medial compartment and patellofemoral compartment changes.  IMPRESSION:  1. Severe left hip arthritis, osteoarthritis versus rheumatoid.  2. Arthritis of left knee, osteoarthritis versus rheumatoid.  3. Adenocarcinoma of the prostate gland.  4. History of depression and panic attacks.  PLAN: Mr. Catterton will be admitted to [facility] on January 13, 2001, where he will undergo a left total hip arthroplasty by  Dr. Mila Homer. [place].  He will undergo all the routine preoperative laboratory tests and studies prior to this procedure.  If he has any medical problems while he is hospitalized we will consult Dr. Phylliss Bob or Dr. Kriste Basque. Dictated by:   Arnoldo Morale, P.A. Attending:  Mila Homer. Sherlean Foot, M.D. DD:  01/10/01 TD:  01/11/01 Job: [id number]

## 2010-08-11 NOTE — Discharge Summary (Signed)
[place]. [facility]  Patient:    Miguel [name], Miguel [name] Visit Number: [id number] MRN: [id number]          Service Type: Attending:  Georgena Spurling, M.D. Dictated by:   Jamelle Rushing, P.A.C. Adm. Date:  01/13/01 Disc. Date: 01/17/01   CC:         Helene Kelp, M.D.                           Discharge Summary  ADMISSION DIAGNOSES: 1. Severe left hip osteoarthritis. 2. Adenocarcinoma of the prostate gland. 3. History of depression and panic attacks.  DISCHARGE DIAGNOSES: 1. Left total hip arthroplasty. 2. Postoperative blood loss anemia. 3. Temporary peroneal palsy. 4. History of adenocarcinoma of the prostate gland. 5. History of depression and panic attacks.  HISTORY OF PRESENT ILLNESS:  The patient is a 77 year old white male with a history of rheumatoid arthritis who presents with one year history of progressively worsening left hip pain.  The pain initially was gradual onset with no injury or surgical procedures.  The patient states that the pain is currently a constant aching pain with sharp shooting pains with weightbearing activities.  The pain is located mainly in the left knee and thigh with radiation down to the tibia and up into the hip.  The pain worsens with walking and standing.  It improves with rest and heat.  ALLERGIES:  DAYPRO, IBUPROFEN.  CURRENT MEDICATIONS: 1. Prednisone 2.5 mg p.o. q.d. 2. Folic acid 1 mg p.o. q.d. 3. Ferrous sulfate 325 mg b.i.d. 4. Methotrexate 2.5 mg four tablets q. Wednesday. 5. Xanax 0.5 mg one and a half tablets p.o. q.h.s. 6. Vicodin p.r.n.  SURGICAL PROCEDURE:  On January 13, 2001, the patient was taken to the OR by Georgena Spurling, M.D., assisted by Jamelle Rushing, P.A.C.  Under general anesthesia, the patient underwent a left total hip arthroplasty.  The patient tolerated the procedure well. There were no complications.  One Hemovac drain was left in place and approximately 500 cc of blood was  lost.  The patient tolerated the procedure well and was transferred to the recovery room and then to the orthopedic floor in good condition.  CONSULTS:  On January 13, 2001, the following routine consults were requested: Physical therapy, occupational therapy, and case management.  HOSPITAL COURSE:  On January 13, 2001, the patient was admitted to [facility] under the care of Dr. Georgena Spurling.  The patient was taken to the OR where a left total hip arthroplasty was performed.  The patient tolerated the procedure well and was transferred to the recovery room and then to the orthopedic floor in good condition.  The patient was started on Lovenox 30 mg subcu q.12h. for routine DVT prophylaxis.  On postoperative day #1, the patient had no specific complaints. Vital signs were stable. The patient did have some postoperative blood loss anemia where his H&H was 8.4 and 24.5, and he had donated some autologous blood so two units of autologous blood were transfused without any difficulties.  The patient then continued to work with physical therapy very well and on about postop day #2, he did develop some increased tingling, burning sensation and soreness over his left lateral ankle and foot.  The patient had thigh-high TEDs on but were extremely tight around the peroneal nerve injury so these were taken off and he had calf TEDs placed with significant improvement of this peroneal palsy  over the next day or so. The patient had progressed very nicely with physical therapy, where on postop day #4, he was both medically and orthopedically stable to be discharged home.  The patients vital signs were stable, he was afebrile.  His left hip wound did have a moderate amount of serosanguineous discharge, so he was placed on Keflex 500 p.o. q.i.d. for a total of five days for prophylactic treatment.  His leg was neuromotor vascularly intact and the patient had no other complaints.  The  patient was discharged home after one last course of physical therapy.  LABS:  CBC on January 16, 2001, showed wbc 8.0, hemoglobin 9.9, hematocrit 29.5, platelets 196.  Routine chemistries on January 16, 2001, showed sodium 139, potassium 3.8, glucose 96, BUN 12, creatinine 1.2.  Urinalysis on admission was normal.  The patient received a total of two units of autologous blood.  Chest x-ray on admission showed cardiomegaly with bibasilar atelectasis.  EKG on admission was sinus bradycardia, minimal voltage criteria for left ventricular hypertrophy, otherwise normal variant.  MEDICATIONS ON DISCHARGE:  1. Colace 100 mg p.o. b.i.d.  2. Senokot 8.6 mg b.i.d. a.c.  3. Lovenox 40 mg subcu q.d.  4. Prednisone 2.5 mg p.o. q.d.  5. Folic acid 1 mg p.o. q.d.  6. Ferrous sulfate 325 mg p.o. b.i.d.  7. Alprazolam 0.75 mg p.o. q.h.s.  8. OxyContin CR 20 mg p.o. q.12h.  9. Potassium chloride 40 mEq p.o. b.i.d. to be discontinued on discharge. 10. Xanax 0.25 mg p.o. p.r.n. 11. Enema or laxative of choice p.r.n. 12. Percocet one or two tablets every four to six hour p.r.n. 13. Tylenol 650 mg p.o. q.4h. p.r.n. 14. Robaxin 500 mg p.o. q.6-8h. p.r.n. 15. Maalox 30 mL p.o. p.r.n. 16. Please hold methotrexate for this week and one more week and may take     on the following Wednesday after seeing Dr. Sherlean Foot.  If having problems     with rheumatoid arthritis, please contact W. Chase Picket, M.D., for any     suggestions.  DISCHARGE INSTRUCTIONS: 1. Medications:  The patient is to continue routine home medications.    a. Lovenox 40 mg subcu once a day for 10 days.    b. OxyContin CR 20 mg one tablet every 12 hours until gone.    c. Percocet 5 mg one or two tablets every four to six hours for pain if       needed.    d. Keflex 500 mg one tablet four times a day for a total of five days. 2. Activity:  As tolerated with use of a walker. 3. Diet:  No restrictions. 4. Wound care:  Keep wound clean and  dry.  May shower.  Check daily for     infection.  If present, call physician. 5. Special instructions:  [facility] for physical therapy. 6. Follow-up:  The patient is to call [phone] for follow-up appointment in    10 days with Dr. Sherlean Foot.  DISCHARGE CONDITION:  The patients condition on discharge is improved and good. Dictated by:   Jamelle Rushing, P.A.C. Attending:  Georgena Spurling, M.D. DD:  01/17/01 TD:  01/20/01 Job: [id number]

## 2010-08-11 NOTE — Op Note (Signed)
NAME:  [name], Miguel HARDGE                     ACCOUNT NO.:  [id number]   MEDICAL RECORD NO.:  [id number]                   PATIENT TYPE:  AMB   LOCATION:  ENDO                                 FACILITY:  [facility]   PHYSICIAN:  Petra Kuba, M.D.                 DATE OF BIRTH:  [birth year]   DATE OF PROCEDURE:  12/01/2002  DATE OF DISCHARGE:                                 OPERATIVE REPORT   PROCEDURE:  Colonoscopy with polypectomy.   INDICATIONS FOR PROCEDURE:  A patient with a history of colon polyps due for  repeat screening.  Consent was signed after risks, benefits, methods, and  options were thoroughly discussed multiple times in the past with both the  patient and his wife.   MEDICINES USED:  Demerol 70, Versed 7.   DESCRIPTION OF PROCEDURE:  Rectal inspection was pertinent for external  hemorrhoids, small. Digital exam was negative. The video colonoscope was  inserted, easily advanced around the colon to the cecum. This did require  some abdominal pressure but no position changes. Other than some left sided  diverticula, no abnormality was seen on the left side.  On insertion in the  transverse, two tiny polyps were seen and were both hot biopsied and put in  the same container.  The cecum was identified by the appendiceal orifice and  the ileocecal valve. Questionable two tiny cecal polyps were seen and were  cold biopsied x2 each and put in a different container. The prep was  adequate. There was some liquid stool that required washing and suctioning.  On slow withdrawal through the colon, no other polypoid lesions were seen as  we slowly withdrew back to the rectum.  The polyps seen in the transverse  were re-hot biopsied on withdrawal  and there were some left sided moderate  diverticula but no other abnormalities. Anorectal pull-through and  retroflexion confirmed some small hemorrhoids. The scope was reinserted a  short ways up the left side of the colon, air was  suctioned, scope removed.  The patient tolerated the procedure well. There was no obvious or immediate  complication.   ENDOSCOPIC DIAGNOSIS:  1. Internal and external small hemorrhoids.  2. Left sided diverticula.  3. Two tiny transverse polyps hot biopsied.  4. Questionably two tiny cecal polyps cold biopsied and put in a separate     container.  5. Otherwise within normal limits to the cecum.    PLAN:  Await pathology to determine future colonic screening as long as  doing well medically. Happy to see back p.r.n., otherwise, return care to  doctors Phylliss Bob and Patsi Sears for the customary health care maintenance to  include yearly rectals and guaiacs.  Petra Kuba, M.D.    MEM/MEDQ  D:  12/01/2002  T:  12/01/2002  Job:  [id number]   cc:   Areatha Keas, M.D.  [address]  [address]  [place]  [place] [address]  Fax: [phone]   Sigmund I. Patsi Sears, M.D.  [address]  [place]  [place] [address]  Fax: [phone]

## 2010-12-25 LAB — POCT I-STAT, CHEM 8
Calcium, Ion: 1.29
Chloride: 107
Glucose, Bld: 92
HCT: 30 — ABNORMAL LOW
Hemoglobin: 10.2 — ABNORMAL LOW
Potassium: 3.9

## 2010-12-25 LAB — DIFFERENTIAL
Basophils Absolute: 0.1
Eosinophils Absolute: 0.1
Eosinophils Relative: 1
Monocytes Absolute: 0.9
Monocytes Relative: 9
Neutro Abs: 7.1

## 2010-12-25 LAB — URINALYSIS, ROUTINE W REFLEX MICROSCOPIC
Bilirubin Urine: NEGATIVE
Ketones, ur: NEGATIVE
Leukocytes, UA: NEGATIVE
Nitrite: NEGATIVE
Protein, ur: NEGATIVE
Urobilinogen, UA: 0.2
pH: 5.5

## 2010-12-25 LAB — BASIC METABOLIC PANEL
CO2: 23
Calcium: 8.6
Chloride: 112
GFR calc Af Amer: >60
Glucose, Bld: 94
Potassium: 4.4
Sodium: 140

## 2010-12-25 LAB — CBC
HCT: 27.4 — ABNORMAL LOW
HCT: 31.7 — ABNORMAL LOW
Hemoglobin: 10.7 — ABNORMAL LOW
Hemoglobin: 9.4 — ABNORMAL LOW
MCHC: 33.7
MCHC: 34.5
MCV: 97.1
MCV: 97.4
RBC: 2.82 — ABNORMAL LOW
RBC: 3.26 — ABNORMAL LOW
RDW: 14.8

## 2010-12-25 LAB — TSH: TSH: 1.448

## 2010-12-25 LAB — PROTIME-INR
INR: 1.1
Prothrombin Time: 15.7 — ABNORMAL HIGH

## 2010-12-25 LAB — HEPATIC FUNCTION PANEL
ALT: 13
AST: 16
Bilirubin, Direct: 0.1
Indirect Bilirubin: 0.6
Total Protein: 6.2

## 2010-12-25 LAB — POCT CARDIAC MARKERS
Myoglobin, poc: 136
Myoglobin, poc: 85.2

## 2010-12-25 LAB — APTT: aPTT: 40 — ABNORMAL HIGH

## 2010-12-25 LAB — URINE CULTURE

## 2010-12-25 LAB — D-DIMER, QUANTITATIVE: D-Dimer, Quant: 2.94 — ABNORMAL HIGH

## 2010-12-25 LAB — B-NATRIURETIC PEPTIDE (CONVERTED LAB): Pro B Natriuretic peptide (BNP): 77

## 2010-12-26 LAB — BASIC METABOLIC PANEL
CO2: 20
CO2: 23
CO2: 26
Calcium: 9
Chloride: 108
Chloride: 109
Chloride: 112
Creatinine, Ser: 1.14
Creatinine, Ser: 1.29
GFR calc Af Amer: >60
GFR calc Af Amer: >60
GFR calc Af Amer: >60
GFR calc non Af Amer: 53 — ABNORMAL LOW
GFR calc non Af Amer: >60
Glucose, Bld: 102 — ABNORMAL HIGH
Potassium: 3.7
Potassium: 3.9
Potassium: 4.7
Potassium: 4.7
Sodium: 139
Sodium: 141
Sodium: 142

## 2010-12-26 LAB — CBC
HCT: 26.9 — ABNORMAL LOW
HCT: 27.6 — ABNORMAL LOW
HCT: 28.5 — ABNORMAL LOW
HCT: 28.9 — ABNORMAL LOW
Hemoglobin: 9.2 — ABNORMAL LOW
Hemoglobin: 9.5 — ABNORMAL LOW
Hemoglobin: 9.5 — ABNORMAL LOW
Hemoglobin: 9.5 — ABNORMAL LOW
Hemoglobin: 9.7 — ABNORMAL LOW
MCHC: 33.4
MCHC: 33.5
MCHC: 34.1
MCV: 98.1
MCV: 98.1
RBC: 2.81 — ABNORMAL LOW
RBC: 2.82 — ABNORMAL LOW
RBC: 2.83 — ABNORMAL LOW
RBC: 2.9 — ABNORMAL LOW
RBC: 2.96 — ABNORMAL LOW
WBC: 5.3
WBC: 6.7
WBC: 7.1

## 2010-12-26 LAB — HEPARIN LEVEL (UNFRACTIONATED)
Heparin Unfractionated: 0.34
Heparin Unfractionated: 0.4

## 2010-12-26 LAB — PROTIME-INR
INR: 2.5 — ABNORMAL HIGH
INR: 2.6 — ABNORMAL HIGH
Prothrombin Time: 29 — ABNORMAL HIGH
Prothrombin Time: 29.6 — ABNORMAL HIGH

## 2011-03-29 DIAGNOSIS — I10 Essential (primary) hypertension: Secondary | ICD-10-CM | POA: Diagnosis not present

## 2011-04-05 DIAGNOSIS — IMO0002 Reserved for concepts with insufficient information to code with codable children: Secondary | ICD-10-CM | POA: Diagnosis not present

## 2011-04-05 DIAGNOSIS — R5383 Other fatigue: Secondary | ICD-10-CM | POA: Diagnosis not present

## 2011-04-05 DIAGNOSIS — R5381 Other malaise: Secondary | ICD-10-CM | POA: Diagnosis not present

## 2011-04-05 DIAGNOSIS — M159 Polyosteoarthritis, unspecified: Secondary | ICD-10-CM | POA: Diagnosis not present

## 2011-04-10 DIAGNOSIS — I82890 Acute embolism and thrombosis of other specified veins: Secondary | ICD-10-CM | POA: Diagnosis not present

## 2011-04-10 DIAGNOSIS — I2699 Other pulmonary embolism without acute cor pulmonale: Secondary | ICD-10-CM | POA: Diagnosis not present

## 2011-04-10 DIAGNOSIS — Z7901 Long term (current) use of anticoagulants: Secondary | ICD-10-CM | POA: Diagnosis not present

## 2011-05-07 DIAGNOSIS — M159 Polyosteoarthritis, unspecified: Secondary | ICD-10-CM | POA: Diagnosis not present

## 2011-05-07 DIAGNOSIS — IMO0002 Reserved for concepts with insufficient information to code with codable children: Secondary | ICD-10-CM | POA: Diagnosis not present

## 2011-05-07 DIAGNOSIS — M069 Rheumatoid arthritis, unspecified: Secondary | ICD-10-CM | POA: Diagnosis not present

## 2011-05-14 DIAGNOSIS — F323 Major depressive disorder, single episode, severe with psychotic features: Secondary | ICD-10-CM | POA: Diagnosis not present

## 2011-05-15 DIAGNOSIS — I82890 Acute embolism and thrombosis of other specified veins: Secondary | ICD-10-CM | POA: Diagnosis not present

## 2011-05-15 DIAGNOSIS — Z7901 Long term (current) use of anticoagulants: Secondary | ICD-10-CM | POA: Diagnosis not present

## 2011-05-15 DIAGNOSIS — I2699 Other pulmonary embolism without acute cor pulmonale: Secondary | ICD-10-CM | POA: Diagnosis not present

## 2011-05-29 DIAGNOSIS — I2699 Other pulmonary embolism without acute cor pulmonale: Secondary | ICD-10-CM | POA: Diagnosis not present

## 2011-05-29 DIAGNOSIS — I82890 Acute embolism and thrombosis of other specified veins: Secondary | ICD-10-CM | POA: Diagnosis not present

## 2011-05-29 DIAGNOSIS — Z7901 Long term (current) use of anticoagulants: Secondary | ICD-10-CM | POA: Diagnosis not present

## 2011-06-12 DIAGNOSIS — Z7901 Long term (current) use of anticoagulants: Secondary | ICD-10-CM | POA: Diagnosis not present

## 2011-06-12 DIAGNOSIS — I824Y9 Acute embolism and thrombosis of unspecified deep veins of unspecified proximal lower extremity: Secondary | ICD-10-CM | POA: Diagnosis not present

## 2011-06-12 DIAGNOSIS — I2699 Other pulmonary embolism without acute cor pulmonale: Secondary | ICD-10-CM | POA: Diagnosis not present

## 2011-07-05 DIAGNOSIS — Z7901 Long term (current) use of anticoagulants: Secondary | ICD-10-CM | POA: Diagnosis not present

## 2011-07-05 DIAGNOSIS — Z79899 Other long term (current) drug therapy: Secondary | ICD-10-CM | POA: Diagnosis not present

## 2011-07-05 DIAGNOSIS — M069 Rheumatoid arthritis, unspecified: Secondary | ICD-10-CM | POA: Diagnosis not present

## 2011-07-05 DIAGNOSIS — I2699 Other pulmonary embolism without acute cor pulmonale: Secondary | ICD-10-CM | POA: Diagnosis not present

## 2011-08-14 DIAGNOSIS — I824Y9 Acute embolism and thrombosis of unspecified deep veins of unspecified proximal lower extremity: Secondary | ICD-10-CM | POA: Diagnosis not present

## 2011-08-14 DIAGNOSIS — Z7901 Long term (current) use of anticoagulants: Secondary | ICD-10-CM | POA: Diagnosis not present

## 2011-08-14 DIAGNOSIS — I2699 Other pulmonary embolism without acute cor pulmonale: Secondary | ICD-10-CM | POA: Diagnosis not present

## 2011-08-30 DIAGNOSIS — F332 Major depressive disorder, recurrent severe without psychotic features: Secondary | ICD-10-CM | POA: Diagnosis not present

## 2011-09-04 DIAGNOSIS — F323 Major depressive disorder, single episode, severe with psychotic features: Secondary | ICD-10-CM | POA: Diagnosis not present

## 2011-09-13 DIAGNOSIS — Z7901 Long term (current) use of anticoagulants: Secondary | ICD-10-CM | POA: Diagnosis not present

## 2011-09-13 DIAGNOSIS — I2699 Other pulmonary embolism without acute cor pulmonale: Secondary | ICD-10-CM | POA: Diagnosis not present

## 2011-09-13 DIAGNOSIS — I824Y9 Acute embolism and thrombosis of unspecified deep veins of unspecified proximal lower extremity: Secondary | ICD-10-CM | POA: Diagnosis not present

## 2011-09-21 DIAGNOSIS — IMO0002 Reserved for concepts with insufficient information to code with codable children: Secondary | ICD-10-CM | POA: Diagnosis not present

## 2011-09-21 DIAGNOSIS — M159 Polyosteoarthritis, unspecified: Secondary | ICD-10-CM | POA: Diagnosis not present

## 2011-09-21 DIAGNOSIS — M069 Rheumatoid arthritis, unspecified: Secondary | ICD-10-CM | POA: Diagnosis not present

## 2011-10-10 DIAGNOSIS — M159 Polyosteoarthritis, unspecified: Secondary | ICD-10-CM | POA: Diagnosis not present

## 2011-10-10 DIAGNOSIS — J069 Acute upper respiratory infection, unspecified: Secondary | ICD-10-CM | POA: Diagnosis not present

## 2011-10-10 DIAGNOSIS — M069 Rheumatoid arthritis, unspecified: Secondary | ICD-10-CM | POA: Diagnosis not present

## 2011-10-10 DIAGNOSIS — Z7901 Long term (current) use of anticoagulants: Secondary | ICD-10-CM | POA: Diagnosis not present

## 2011-10-11 DIAGNOSIS — I824Y9 Acute embolism and thrombosis of unspecified deep veins of unspecified proximal lower extremity: Secondary | ICD-10-CM | POA: Diagnosis not present

## 2011-10-11 DIAGNOSIS — Z7901 Long term (current) use of anticoagulants: Secondary | ICD-10-CM | POA: Diagnosis not present

## 2011-10-11 DIAGNOSIS — I2699 Other pulmonary embolism without acute cor pulmonale: Secondary | ICD-10-CM | POA: Diagnosis not present

## 2011-10-17 DIAGNOSIS — M159 Polyosteoarthritis, unspecified: Secondary | ICD-10-CM | POA: Diagnosis not present

## 2011-10-17 DIAGNOSIS — IMO0002 Reserved for concepts with insufficient information to code with codable children: Secondary | ICD-10-CM | POA: Diagnosis not present

## 2011-10-17 DIAGNOSIS — I824Y9 Acute embolism and thrombosis of unspecified deep veins of unspecified proximal lower extremity: Secondary | ICD-10-CM | POA: Diagnosis not present

## 2011-10-17 DIAGNOSIS — M069 Rheumatoid arthritis, unspecified: Secondary | ICD-10-CM | POA: Diagnosis not present

## 2011-10-17 DIAGNOSIS — I2699 Other pulmonary embolism without acute cor pulmonale: Secondary | ICD-10-CM | POA: Diagnosis not present

## 2011-10-18 DIAGNOSIS — M79609 Pain in unspecified limb: Secondary | ICD-10-CM | POA: Diagnosis not present

## 2011-10-18 DIAGNOSIS — Z189 Retained foreign body fragments, unspecified material: Secondary | ICD-10-CM | POA: Diagnosis not present

## 2011-10-29 DIAGNOSIS — Z961 Presence of intraocular lens: Secondary | ICD-10-CM | POA: Diagnosis not present

## 2011-10-29 DIAGNOSIS — H04129 Dry eye syndrome of unspecified lacrimal gland: Secondary | ICD-10-CM | POA: Diagnosis not present

## 2011-10-30 DIAGNOSIS — M4310 Spondylolisthesis, site unspecified: Secondary | ICD-10-CM | POA: Diagnosis not present

## 2011-10-30 DIAGNOSIS — M5137 Other intervertebral disc degeneration, lumbosacral region: Secondary | ICD-10-CM | POA: Diagnosis not present

## 2011-10-30 DIAGNOSIS — M48061 Spinal stenosis, lumbar region without neurogenic claudication: Secondary | ICD-10-CM | POA: Diagnosis not present

## 2011-10-30 DIAGNOSIS — M47817 Spondylosis without myelopathy or radiculopathy, lumbosacral region: Secondary | ICD-10-CM | POA: Diagnosis not present

## 2011-10-30 DIAGNOSIS — M545 Low back pain, unspecified: Secondary | ICD-10-CM | POA: Diagnosis not present

## 2011-11-02 ENCOUNTER — Other Ambulatory Visit: Payer: Self-pay | Admitting: Neurosurgery

## 2011-11-02 DIAGNOSIS — M5137 Other intervertebral disc degeneration, lumbosacral region: Secondary | ICD-10-CM

## 2011-11-02 DIAGNOSIS — M48061 Spinal stenosis, lumbar region without neurogenic claudication: Secondary | ICD-10-CM

## 2011-11-02 DIAGNOSIS — M47817 Spondylosis without myelopathy or radiculopathy, lumbosacral region: Secondary | ICD-10-CM

## 2011-11-02 DIAGNOSIS — M4310 Spondylolisthesis, site unspecified: Secondary | ICD-10-CM

## 2011-11-07 ENCOUNTER — Ambulatory Visit
Admission: RE | Admit: 2011-11-07 | Discharge: 2011-11-07 | Disposition: A | Discharge status: Home / Self Care | Payer: Medicare Other | Source: Ambulatory Visit | Attending: Neurosurgery | Admitting: Neurosurgery

## 2011-11-07 DIAGNOSIS — M47817 Spondylosis without myelopathy or radiculopathy, lumbosacral region: Secondary | ICD-10-CM | POA: Diagnosis not present

## 2011-11-07 DIAGNOSIS — M48061 Spinal stenosis, lumbar region without neurogenic claudication: Secondary | ICD-10-CM

## 2011-11-07 DIAGNOSIS — M5126 Other intervertebral disc displacement, lumbar region: Secondary | ICD-10-CM | POA: Diagnosis not present

## 2011-11-07 DIAGNOSIS — M5137 Other intervertebral disc degeneration, lumbosacral region: Secondary | ICD-10-CM | POA: Diagnosis not present

## 2011-11-07 DIAGNOSIS — M4310 Spondylolisthesis, site unspecified: Secondary | ICD-10-CM

## 2011-11-13 DIAGNOSIS — I824Y9 Acute embolism and thrombosis of unspecified deep veins of unspecified proximal lower extremity: Secondary | ICD-10-CM | POA: Diagnosis not present

## 2011-11-13 DIAGNOSIS — Z7901 Long term (current) use of anticoagulants: Secondary | ICD-10-CM | POA: Diagnosis not present

## 2011-11-13 DIAGNOSIS — I2699 Other pulmonary embolism without acute cor pulmonale: Secondary | ICD-10-CM | POA: Diagnosis not present

## 2011-11-20 DIAGNOSIS — M5137 Other intervertebral disc degeneration, lumbosacral region: Secondary | ICD-10-CM | POA: Diagnosis not present

## 2011-11-20 DIAGNOSIS — M4310 Spondylolisthesis, site unspecified: Secondary | ICD-10-CM | POA: Diagnosis not present

## 2011-11-20 DIAGNOSIS — M48061 Spinal stenosis, lumbar region without neurogenic claudication: Secondary | ICD-10-CM | POA: Diagnosis not present

## 2011-11-20 DIAGNOSIS — M47817 Spondylosis without myelopathy or radiculopathy, lumbosacral region: Secondary | ICD-10-CM | POA: Diagnosis not present

## 2011-12-13 DIAGNOSIS — I2699 Other pulmonary embolism without acute cor pulmonale: Secondary | ICD-10-CM | POA: Diagnosis not present

## 2011-12-13 DIAGNOSIS — I824Y9 Acute embolism and thrombosis of unspecified deep veins of unspecified proximal lower extremity: Secondary | ICD-10-CM | POA: Diagnosis not present

## 2011-12-13 DIAGNOSIS — Z7901 Long term (current) use of anticoagulants: Secondary | ICD-10-CM | POA: Diagnosis not present

## 2011-12-25 DIAGNOSIS — F323 Major depressive disorder, single episode, severe with psychotic features: Secondary | ICD-10-CM | POA: Diagnosis not present

## 2012-01-09 DIAGNOSIS — Z23 Encounter for immunization: Secondary | ICD-10-CM | POA: Diagnosis not present

## 2012-01-10 DIAGNOSIS — I2699 Other pulmonary embolism without acute cor pulmonale: Secondary | ICD-10-CM | POA: Diagnosis not present

## 2012-01-10 DIAGNOSIS — Z7901 Long term (current) use of anticoagulants: Secondary | ICD-10-CM | POA: Diagnosis not present

## 2012-01-10 DIAGNOSIS — I824Y9 Acute embolism and thrombosis of unspecified deep veins of unspecified proximal lower extremity: Secondary | ICD-10-CM | POA: Diagnosis not present

## 2012-01-21 DIAGNOSIS — M79609 Pain in unspecified limb: Secondary | ICD-10-CM | POA: Diagnosis not present

## 2012-01-21 DIAGNOSIS — IMO0002 Reserved for concepts with insufficient information to code with codable children: Secondary | ICD-10-CM | POA: Diagnosis not present

## 2012-01-21 DIAGNOSIS — M159 Polyosteoarthritis, unspecified: Secondary | ICD-10-CM | POA: Diagnosis not present

## 2012-01-21 DIAGNOSIS — M069 Rheumatoid arthritis, unspecified: Secondary | ICD-10-CM | POA: Diagnosis not present

## 2012-01-24 DIAGNOSIS — Z7901 Long term (current) use of anticoagulants: Secondary | ICD-10-CM | POA: Diagnosis not present

## 2012-01-24 DIAGNOSIS — I824Y9 Acute embolism and thrombosis of unspecified deep veins of unspecified proximal lower extremity: Secondary | ICD-10-CM | POA: Diagnosis not present

## 2012-01-24 DIAGNOSIS — I2699 Other pulmonary embolism without acute cor pulmonale: Secondary | ICD-10-CM | POA: Diagnosis not present

## 2012-02-08 DIAGNOSIS — C61 Malignant neoplasm of prostate: Secondary | ICD-10-CM | POA: Diagnosis not present

## 2012-02-15 DIAGNOSIS — M899 Disorder of bone, unspecified: Secondary | ICD-10-CM | POA: Diagnosis not present

## 2012-02-15 DIAGNOSIS — C61 Malignant neoplasm of prostate: Secondary | ICD-10-CM | POA: Diagnosis not present

## 2012-02-19 DIAGNOSIS — Z7901 Long term (current) use of anticoagulants: Secondary | ICD-10-CM | POA: Diagnosis not present

## 2012-02-19 DIAGNOSIS — I2699 Other pulmonary embolism without acute cor pulmonale: Secondary | ICD-10-CM | POA: Diagnosis not present

## 2012-02-19 DIAGNOSIS — I824Y9 Acute embolism and thrombosis of unspecified deep veins of unspecified proximal lower extremity: Secondary | ICD-10-CM | POA: Diagnosis not present

## 2012-03-17 DIAGNOSIS — I824Y9 Acute embolism and thrombosis of unspecified deep veins of unspecified proximal lower extremity: Secondary | ICD-10-CM | POA: Diagnosis not present

## 2012-03-17 DIAGNOSIS — I2699 Other pulmonary embolism without acute cor pulmonale: Secondary | ICD-10-CM | POA: Diagnosis not present

## 2012-03-17 DIAGNOSIS — Z7901 Long term (current) use of anticoagulants: Secondary | ICD-10-CM | POA: Diagnosis not present

## 2012-03-21 DIAGNOSIS — F323 Major depressive disorder, single episode, severe with psychotic features: Secondary | ICD-10-CM | POA: Diagnosis not present

## 2012-04-16 DIAGNOSIS — IMO0002 Reserved for concepts with insufficient information to code with codable children: Secondary | ICD-10-CM | POA: Diagnosis not present

## 2012-04-16 DIAGNOSIS — I2699 Other pulmonary embolism without acute cor pulmonale: Secondary | ICD-10-CM | POA: Diagnosis not present

## 2012-04-16 DIAGNOSIS — M069 Rheumatoid arthritis, unspecified: Secondary | ICD-10-CM | POA: Diagnosis not present

## 2012-04-16 DIAGNOSIS — M159 Polyosteoarthritis, unspecified: Secondary | ICD-10-CM | POA: Diagnosis not present

## 2012-04-16 DIAGNOSIS — I10 Essential (primary) hypertension: Secondary | ICD-10-CM | POA: Diagnosis not present

## 2012-04-17 DIAGNOSIS — I824Y9 Acute embolism and thrombosis of unspecified deep veins of unspecified proximal lower extremity: Secondary | ICD-10-CM | POA: Diagnosis not present

## 2012-04-17 DIAGNOSIS — Z7901 Long term (current) use of anticoagulants: Secondary | ICD-10-CM | POA: Diagnosis not present

## 2012-04-17 DIAGNOSIS — I2699 Other pulmonary embolism without acute cor pulmonale: Secondary | ICD-10-CM | POA: Diagnosis not present

## 2012-04-23 DIAGNOSIS — M069 Rheumatoid arthritis, unspecified: Secondary | ICD-10-CM | POA: Diagnosis not present

## 2012-04-23 DIAGNOSIS — R109 Unspecified abdominal pain: Secondary | ICD-10-CM | POA: Diagnosis not present

## 2012-04-23 DIAGNOSIS — J310 Chronic rhinitis: Secondary | ICD-10-CM | POA: Diagnosis not present

## 2012-04-23 DIAGNOSIS — N189 Chronic kidney disease, unspecified: Secondary | ICD-10-CM | POA: Diagnosis not present

## 2012-04-23 DIAGNOSIS — M159 Polyosteoarthritis, unspecified: Secondary | ICD-10-CM | POA: Diagnosis not present

## 2012-05-15 DIAGNOSIS — I2699 Other pulmonary embolism without acute cor pulmonale: Secondary | ICD-10-CM | POA: Diagnosis not present

## 2012-05-15 DIAGNOSIS — Z7901 Long term (current) use of anticoagulants: Secondary | ICD-10-CM | POA: Diagnosis not present

## 2012-05-15 DIAGNOSIS — I824Y9 Acute embolism and thrombosis of unspecified deep veins of unspecified proximal lower extremity: Secondary | ICD-10-CM | POA: Diagnosis not present

## 2012-05-22 DIAGNOSIS — M159 Polyosteoarthritis, unspecified: Secondary | ICD-10-CM | POA: Diagnosis not present

## 2012-05-22 DIAGNOSIS — IMO0002 Reserved for concepts with insufficient information to code with codable children: Secondary | ICD-10-CM | POA: Diagnosis not present

## 2012-05-22 DIAGNOSIS — M069 Rheumatoid arthritis, unspecified: Secondary | ICD-10-CM | POA: Diagnosis not present

## 2012-05-23 DIAGNOSIS — F332 Major depressive disorder, recurrent severe without psychotic features: Secondary | ICD-10-CM | POA: Diagnosis not present

## 2012-06-12 DIAGNOSIS — I824Y9 Acute embolism and thrombosis of unspecified deep veins of unspecified proximal lower extremity: Secondary | ICD-10-CM | POA: Diagnosis not present

## 2012-06-12 DIAGNOSIS — I2699 Other pulmonary embolism without acute cor pulmonale: Secondary | ICD-10-CM | POA: Diagnosis not present

## 2012-06-12 DIAGNOSIS — Z7901 Long term (current) use of anticoagulants: Secondary | ICD-10-CM | POA: Diagnosis not present

## 2012-06-23 DIAGNOSIS — F323 Major depressive disorder, single episode, severe with psychotic features: Secondary | ICD-10-CM | POA: Diagnosis not present

## 2012-07-10 DIAGNOSIS — I824Y9 Acute embolism and thrombosis of unspecified deep veins of unspecified proximal lower extremity: Secondary | ICD-10-CM | POA: Diagnosis not present

## 2012-07-10 DIAGNOSIS — Z7901 Long term (current) use of anticoagulants: Secondary | ICD-10-CM | POA: Diagnosis not present

## 2012-07-10 DIAGNOSIS — I2699 Other pulmonary embolism without acute cor pulmonale: Secondary | ICD-10-CM | POA: Diagnosis not present

## 2012-08-11 DIAGNOSIS — I824Y9 Acute embolism and thrombosis of unspecified deep veins of unspecified proximal lower extremity: Secondary | ICD-10-CM | POA: Diagnosis not present

## 2012-08-11 DIAGNOSIS — I2699 Other pulmonary embolism without acute cor pulmonale: Secondary | ICD-10-CM | POA: Diagnosis not present

## 2012-08-11 DIAGNOSIS — Z7901 Long term (current) use of anticoagulants: Secondary | ICD-10-CM | POA: Diagnosis not present

## 2012-08-21 DIAGNOSIS — M069 Rheumatoid arthritis, unspecified: Secondary | ICD-10-CM | POA: Diagnosis not present

## 2012-08-21 DIAGNOSIS — IMO0002 Reserved for concepts with insufficient information to code with codable children: Secondary | ICD-10-CM | POA: Diagnosis not present

## 2012-08-21 DIAGNOSIS — M159 Polyosteoarthritis, unspecified: Secondary | ICD-10-CM | POA: Diagnosis not present

## 2012-09-08 DIAGNOSIS — I824Y9 Acute embolism and thrombosis of unspecified deep veins of unspecified proximal lower extremity: Secondary | ICD-10-CM | POA: Diagnosis not present

## 2012-09-08 DIAGNOSIS — I2699 Other pulmonary embolism without acute cor pulmonale: Secondary | ICD-10-CM | POA: Diagnosis not present

## 2012-09-08 DIAGNOSIS — Z7901 Long term (current) use of anticoagulants: Secondary | ICD-10-CM | POA: Diagnosis not present

## 2012-10-06 DIAGNOSIS — I824Y9 Acute embolism and thrombosis of unspecified deep veins of unspecified proximal lower extremity: Secondary | ICD-10-CM | POA: Diagnosis not present

## 2012-10-06 DIAGNOSIS — I2699 Other pulmonary embolism without acute cor pulmonale: Secondary | ICD-10-CM | POA: Diagnosis not present

## 2012-10-06 DIAGNOSIS — Z7901 Long term (current) use of anticoagulants: Secondary | ICD-10-CM | POA: Diagnosis not present

## 2012-10-13 DIAGNOSIS — F323 Major depressive disorder, single episode, severe with psychotic features: Secondary | ICD-10-CM | POA: Diagnosis not present

## 2012-10-21 DIAGNOSIS — I824Y9 Acute embolism and thrombosis of unspecified deep veins of unspecified proximal lower extremity: Secondary | ICD-10-CM | POA: Diagnosis not present

## 2012-10-21 DIAGNOSIS — I2699 Other pulmonary embolism without acute cor pulmonale: Secondary | ICD-10-CM | POA: Diagnosis not present

## 2012-10-21 DIAGNOSIS — Z7901 Long term (current) use of anticoagulants: Secondary | ICD-10-CM | POA: Diagnosis not present

## 2012-10-29 DIAGNOSIS — Z0000 Encounter for general adult medical examination without abnormal findings: Secondary | ICD-10-CM | POA: Diagnosis not present

## 2012-10-29 DIAGNOSIS — N189 Chronic kidney disease, unspecified: Secondary | ICD-10-CM | POA: Diagnosis not present

## 2012-10-29 DIAGNOSIS — M069 Rheumatoid arthritis, unspecified: Secondary | ICD-10-CM | POA: Diagnosis not present

## 2012-10-29 DIAGNOSIS — Z1331 Encounter for screening for depression: Secondary | ICD-10-CM | POA: Diagnosis not present

## 2012-10-29 DIAGNOSIS — R5383 Other fatigue: Secondary | ICD-10-CM | POA: Diagnosis not present

## 2012-10-29 DIAGNOSIS — I824Y9 Acute embolism and thrombosis of unspecified deep veins of unspecified proximal lower extremity: Secondary | ICD-10-CM | POA: Diagnosis not present

## 2012-11-05 DIAGNOSIS — L259 Unspecified contact dermatitis, unspecified cause: Secondary | ICD-10-CM | POA: Diagnosis not present

## 2012-11-05 DIAGNOSIS — Z23 Encounter for immunization: Secondary | ICD-10-CM | POA: Diagnosis not present

## 2012-11-05 DIAGNOSIS — I498 Other specified cardiac arrhythmias: Secondary | ICD-10-CM | POA: Diagnosis not present

## 2012-11-05 DIAGNOSIS — I2699 Other pulmonary embolism without acute cor pulmonale: Secondary | ICD-10-CM | POA: Diagnosis not present

## 2012-11-05 DIAGNOSIS — N189 Chronic kidney disease, unspecified: Secondary | ICD-10-CM | POA: Diagnosis not present

## 2012-11-06 DIAGNOSIS — H524 Presbyopia: Secondary | ICD-10-CM | POA: Diagnosis not present

## 2012-11-06 DIAGNOSIS — H5200 Hypermetropia, unspecified eye: Secondary | ICD-10-CM | POA: Diagnosis not present

## 2012-11-06 DIAGNOSIS — H52229 Regular astigmatism, unspecified eye: Secondary | ICD-10-CM | POA: Diagnosis not present

## 2012-11-06 DIAGNOSIS — H04129 Dry eye syndrome of unspecified lacrimal gland: Secondary | ICD-10-CM | POA: Diagnosis not present

## 2012-11-19 DIAGNOSIS — I824Y9 Acute embolism and thrombosis of unspecified deep veins of unspecified proximal lower extremity: Secondary | ICD-10-CM | POA: Diagnosis not present

## 2012-11-19 DIAGNOSIS — I2699 Other pulmonary embolism without acute cor pulmonale: Secondary | ICD-10-CM | POA: Diagnosis not present

## 2012-11-19 DIAGNOSIS — Z7901 Long term (current) use of anticoagulants: Secondary | ICD-10-CM | POA: Diagnosis not present

## 2012-11-20 ENCOUNTER — Ambulatory Visit (INDEPENDENT_AMBULATORY_CARE_PROVIDER_SITE_OTHER): Payer: Medicare Other | Admitting: Cardiovascular Disease

## 2012-11-20 ENCOUNTER — Encounter: Payer: Self-pay | Admitting: Cardiovascular Disease

## 2012-11-20 VITALS — BP 138/62 | HR 45 | Ht 67.0 in | Wt 200.5 lb

## 2012-11-20 DIAGNOSIS — R001 Bradycardia, unspecified: Secondary | ICD-10-CM

## 2012-11-20 DIAGNOSIS — I498 Other specified cardiac arrhythmias: Secondary | ICD-10-CM

## 2012-11-20 DIAGNOSIS — M069 Rheumatoid arthritis, unspecified: Secondary | ICD-10-CM | POA: Diagnosis not present

## 2012-11-20 DIAGNOSIS — I82409 Acute embolism and thrombosis of unspecified deep veins of unspecified lower extremity: Secondary | ICD-10-CM

## 2012-11-20 DIAGNOSIS — IMO0002 Reserved for concepts with insufficient information to code with codable children: Secondary | ICD-10-CM | POA: Diagnosis not present

## 2012-11-20 DIAGNOSIS — R0602 Shortness of breath: Secondary | ICD-10-CM | POA: Diagnosis not present

## 2012-11-20 DIAGNOSIS — M159 Polyosteoarthritis, unspecified: Secondary | ICD-10-CM | POA: Diagnosis not present

## 2012-11-20 NOTE — Patient Instructions (Addendum)
Your physician has recommended that you wear a holter monitor. Holter monitors are medical devices that record the heart's electrical activity. Doctors most often use these monitors to diagnose arrhythmias. Arrhythmias are problems with the speed or rhythm of the heartbeat. The monitor is a small, portable device. You can wear one while you do your normal daily activities. This is usually used to diagnose what is causing palpitations/syncope (passing out)/slow heart rate.  Your physician recommends that you schedule a follow-up appointment in: 2 weeks.

## 2012-11-23 NOTE — Progress Notes (Signed)
Patient ID: Miguel [name], male   DOB: February 18, 1932, 79 y.o.   MRN: [id number]      Reason for office visit Preoperative cardiovascular exam secondary to bradycardia  Mr. 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 He is planning to undergo back surgery. He was found to be markedly bradycardic and is therefore referred for cardiac evaluation..  He has never experienced syncope and denies dizziness but does complain of fatigue and chronic shortness of breath on exertion. He has been exhibiting this to getting older. He also has limited ability to perform physical exercise due to his back problems. He states that he "has always had a slow heartbeat", but usually his heart rate is in the 50s or low 60s.  There is no family history of sudden cardiac death or premature coronary disease, but he does have family members who have required pacemakers.  He has had numerous surgeries in the past including cholecystectomy, prostatectomy, hip replacement, spine surgery (the latter in 2005). Bradycardia was not a problem during any of these. As far as I can tell that there was no clear precipitating cause for his DVTs and pulmonary embolism. Her comorbid conditions include a history of severe depression requiring electroconvulsive therapy and rheumatoid arthritis on methotrexate.    Allergies  Allergen Reactions  . Ibuprofen   . Oxaprozin   . Phenelzine Sulfate     REACTION: if taken with certain foods or other medications can cause an allergic reaction    Current Outpatient Prescriptions  Medication Sig Dispense Refill  . ALPRAZolam (XANAX) 0.25 MG tablet Take 0.25 mg by mouth at bedtime as needed for sleep.      . ARIPiprazole (ABILIFY) 2 MG tablet Take 1 mg by mouth daily.      . calcium-vitamin D (OSCAL WITH D) 500-200 MG-UNIT per tablet Take 1 tablet by mouth daily with breakfast.      .  Cholecalciferol (VITAMIN D3) 2000 UNITS TABS Take by mouth daily.      . Ferrous Sulfate (IRON) 90 (18 FE) MG TABS Take by mouth daily.      Marland Kitchen HYDROcodone-acetaminophen (NORCO/VICODIN) 5-325 MG per tablet as needed.      Marland Kitchen JANTOVEN 5 MG tablet Take 5 mg by mouth as directed.      . lithium carbonate 150 MG capsule Take 150 mg by mouth 2 (two) times daily.      . methotrexate (RHEUMATREX) 2.5 MG tablet Take 2.5 mg by mouth once a week. 4 pills once weekly      . Multiple Vitamin (MULTIVITAMIN) tablet Take 1 tablet by mouth daily.      . sertraline (ZOLOFT) 100 MG tablet Take 100 mg by mouth daily.       No current facility-administered medications for this visit.    No past medical history on file.  No past surgical history on file.  No family history on file.  History   Social History  . Marital Status: Married    Spouse Name: N/A    Number of Children: N/A  . Years of Education: N/A   Occupational History  . Not on file.   Social History Main Topics  . Smoking status: Former Games developer  . Smokeless tobacco: Former Neurosurgeon    Quit date: 03/25/1957  . Alcohol Use: No  . Drug Use: No  . Sexual Activity: Not on file   Other Topics Concern  . Not  on file   Social History Narrative  . No narrative on file    Review of systems: The patient specifically denies any chest pain at rest or with exertion, dyspnea at rest, orthopnea, paroxysmal nocturnal dyspnea, syncope, palpitations, focal neurological deficits, intermittent claudication, lower extremity edema, unexplained weight gain, cough, hemoptysis or wheezing. He has occasional dizziness when he gets out of bed  The patient also denies abdominal pain, nausea, vomiting, dysphagia, diarrhea, constipation, polyuria, polydipsia, dysuria, hematuria, frequency, urgency, abnormal bleeding or bruising, fever, chills, unexpected weight changes, mood swings, change in skin or hair texture, change in voice quality, auditory or visual  problems, allergic reactions or rashes, new musculoskeletal complaints other than usual "aches and pains".  PHYSICAL EXAM BP 138/62  Pulse 45  Ht 5\' 7"  (1.702 m)  Wt 200 lb 8 oz (90.946 kg)  BMI 31.4 kg/m2  General: Alert, oriented x3, no distress Head: no evidence of trauma, PERRL, EOMI, no exophtalmos or lid lag, no myxedema, no xanthelasma; normal ears, nose and oropharynx Neck: normal jugular venous pulsations and no hepatojugular reflux; brisk carotid pulses without delay and no carotid bruits Chest: clear to auscultation, no signs of consolidation by percussion or palpation, normal fremitus, symmetrical and full respiratory excursions Cardiovascular: normal position and quality of the apical impulse, regular markedly bradycardic rhythm, normal first and second heart sounds, no murmurs, rubs or gallops Abdomen: no tenderness or distention, no masses by palpation, no abnormal pulsatility or arterial bruits, normal bowel sounds, no hepatosplenomegaly Extremities: no clubbing, cyanosis or edema; 2+ radial, ulnar and brachial pulses bilaterally; 2+ right femoral, posterior tibial and dorsalis pedis pulses; 2+ left femoral, posterior tibial and dorsalis pedis pulses; no subclavian or femoral bruits Neurological: grossly nonfocal   EKG: Marked sinus bradycardia, delayed R wave progression across the anterior precordium, otherwise normal  Lipid Panel  August 2014 total cholesterol 234, triglycerides 219, HDL 49, LDL 141, hemoglobin A1c 5.1%, TSH normal, normal liver function tests, creatinine 2.0 (estimated GFR 35), hemoglobin 10.6 with macrocytic indices   ASSESSMENT AND PLAN BRADYCARDIA There is fairly convincing evidence that he has symptomatic sinus node dysfunction. There are no medications that he's currently taking that to being terminated in the bradycardia. I suspect that he should receive a dual chamber permanent pacemaker and that he will have improvement in his chronic fatigue  and shortness of breath if he does so. This news took him by surprise and he is definitely not ready to commit to that type of invasive procedure at this time. I gave him some written literature about pacemakers, including an MRI compatible device, since it is likely he'll require MRIs of the lumbar spine in the future. Also recommended that he wear a 24-hour Holter monitor so we can document the response of his heart rate to physical activity.  DVT It has been over 5 years since his episode of DVT and pulmonary embolism, Sophia radically the risk of recurrence should be relatively low. Since no clear cause for DVT/PE was ever identified, he is still at risk for venous thromboembolic complications. Obviously he cannot undergo spine surgery without interrupting anticoagulation medications for a considerable amount of time. All mechanical means of DVT prophylaxis should be employed. His anticoagulation should be resumed as soon it is deemed safe by his neurosurgeon.  Shortness of breath This may be secondary to bradycardia and sinus node chronotropic incompetence   Orders Placed This Encounter  Procedures  . EKG 12-Lead  . Holter monitor - 24 hour  Meds ordered this encounter  Medications  . Ferrous Sulfate (IRON) 90 (18 FE) MG TABS    Sig: Take by mouth daily.  Marland Kitchen HYDROcodone-acetaminophen (NORCO/VICODIN) 5-325 MG per tablet    Sig: as needed.  . lithium carbonate 150 MG capsule    Sig: Take 150 mg by mouth 2 (two) times daily.  . methotrexate (RHEUMATREX) 2.5 MG tablet    Sig: Take 2.5 mg by mouth once a week. 4 pills once weekly  . sertraline (ZOLOFT) 100 MG tablet    Sig: Take 100 mg by mouth daily.  Marland Kitchen JANTOVEN 5 MG tablet    Sig: Take 5 mg by mouth as directed.  Marland Kitchen ALPRAZolam (XANAX) 0.25 MG tablet    Sig: Take 0.25 mg by mouth at bedtime as needed for sleep.  . ARIPiprazole (ABILIFY) 2 MG tablet    Sig: Take 1 mg by mouth daily.  . Multiple Vitamin (MULTIVITAMIN) tablet    Sig:  Take 1 tablet by mouth daily.  . calcium-vitamin D (OSCAL WITH D) 500-200 MG-UNIT per tablet    Sig: Take 1 tablet by mouth daily with breakfast.  . Cholecalciferol (VITAMIN D3) 2000 UNITS TABS    Sig: Take by mouth daily.    Junious Silk, MD, [facility] [phone] office [phone] pager

## 2012-11-23 NOTE — Assessment & Plan Note (Signed)
It has been over 5 years since his episode of DVT and pulmonary embolism, Sophia radically the risk of recurrence should be relatively low. Since no clear cause for DVT/PE was ever identified, he is still at risk for venous thromboembolic complications. Obviously he cannot undergo spine surgery without interrupting anticoagulation medications for a considerable amount of time. All mechanical means of DVT prophylaxis should be employed. His anticoagulation should be resumed as soon it is deemed safe by his neurosurgeon.

## 2012-11-23 NOTE — Assessment & Plan Note (Signed)
This may be secondary to bradycardia and sinus node chronotropic incompetence

## 2012-11-23 NOTE — Assessment & Plan Note (Signed)
There is fairly convincing evidence that he has symptomatic sinus node dysfunction. There are no medications that he's currently taking that to being terminated in the bradycardia. I suspect that he should receive a dual chamber permanent pacemaker and that he will have improvement in his chronic fatigue and shortness of breath if he does so. This news took him by surprise and he is definitely not ready to commit to that type of invasive procedure at this time. I gave him some written literature about pacemakers, including an MRI compatible device, since it is likely he'll require MRIs of the lumbar spine in the future. Also recommended that he wear a 24-hour Holter monitor so we can document the response of his heart rate to physical activity.

## 2012-12-02 ENCOUNTER — Ambulatory Visit (INDEPENDENT_AMBULATORY_CARE_PROVIDER_SITE_OTHER): Payer: Medicare Other | Admitting: Cardiovascular Disease

## 2012-12-02 VITALS — BP 116/64 | HR 49 | Resp 16 | Ht 67.0 in | Wt 200.0 lb

## 2012-12-02 DIAGNOSIS — R001 Bradycardia, unspecified: Secondary | ICD-10-CM

## 2012-12-02 DIAGNOSIS — I2699 Other pulmonary embolism without acute cor pulmonale: Secondary | ICD-10-CM

## 2012-12-02 DIAGNOSIS — I498 Other specified cardiac arrhythmias: Secondary | ICD-10-CM | POA: Diagnosis not present

## 2012-12-02 NOTE — Patient Instructions (Addendum)
Your physician recommends that you schedule a follow-up appointment in: 12 months. Please call immediately for any episodes of loss of consciousness or near fainting. Please call at any time if  think your fatigue has progressed to the point that you wish to consider a pacemaker

## 2012-12-03 ENCOUNTER — Encounter: Payer: Self-pay | Admitting: Cardiovascular Disease

## 2012-12-03 NOTE — Assessment & Plan Note (Signed)
The risk of recurrent deep venous thrombosis and pulmonary embolism with protracted interruption of warfarin for lumbar spine surgery should be considered relatively high in my opinion. In fact it might be best to implant an inferior vena cava filter prior to interruption of anticoagulation. It is likely that he will have to be off warfarin for minimum of 6 weeks and will be stricken with limited mobility during that time. As we were having this discussion he told me that he has 40 decided that the risks of back surgery are not warranted and he thinks he will leave "well enough alone". I do think that with appropriate precautions he could have the back surgery. I'm not sure that an anesthesiologist would agreed to provide general anesthesia if he presents with a heart rate of 45 beats per minute and no pacemaker.

## 2012-12-03 NOTE — Progress Notes (Signed)
Patient ID: Miguel [name], male   DOB: 1932/01/23, 79 y.o.   MRN: [id number]     Reason for office visit Bradycardia, followup Holter monitor  Miguel [name] wore a 24-hour monitor that confirms the suspected diagnosis of sinus node dysfunction and chronotropic incompetence. The maximum heart rate achieved throughout the whole day was only 80 beats per minute and the minimum heart rate was 39 beats per minute. This was encountered during the daytime hours at 4:00 in the afternoon. His average heart rate was only 55 beats per minute. There were no significant pauses in excess of 2 seconds. Occasional premature atrial beats and premature ventricular beats were seen but there were no significant episodes of atrial fibrillation or ventricular tachycardia. One run of accelerated idioventricular rhythm was seen on a background of marked sinus bradycardia.  I think Miguel [name] truly meets criteria for implantation of a pacemaker. He does complain of fatigue and dyspnea. On the other hand he has never had syncope and he feels that his back problems are much more limiting than his fatigue. He also seems to have an intrinsic dislike of implanted devices and shows great reluctance to consider implantation of a defibrillator.  His initial evaluation for bradycardia was prompted in turn by preoperative evaluation for back surgery. This is most related to a need to discontinue anticoagulation therapy in this gentleman who has had massive bilateral pulmonary embolism in the past. He appears to be greatly dissuaded from pursuing back surgery secondary to the risks of warfarin instructions    Allergies  Allergen Reactions  . Ibuprofen   . Oxaprozin   . Phenelzine Sulfate     REACTION: if taken with certain foods or other medications can cause an allergic reaction    Current Outpatient Prescriptions  Medication Sig Dispense Refill  . ALPRAZolam (XANAX) 0.25 MG tablet Take 0.25 mg by mouth at bedtime  as needed for sleep.      . ARIPiprazole (ABILIFY) 2 MG tablet Take 1 mg by mouth daily.      . calcium-vitamin D (OSCAL WITH D) 500-200 MG-UNIT per tablet Take 1 tablet by mouth daily with breakfast.      . Cholecalciferol (VITAMIN D3) 2000 UNITS TABS Take by mouth daily.      . Ferrous Sulfate (IRON) 90 (18 FE) MG TABS Take by mouth daily.      Marland Kitchen HYDROcodone-acetaminophen (NORCO/VICODIN) 5-325 MG per tablet as needed.      Marland Kitchen JANTOVEN 5 MG tablet Take 5 mg by mouth as directed.      . lithium carbonate 150 MG capsule Take 150 mg by mouth 2 (two) times daily.      . methotrexate (RHEUMATREX) 2.5 MG tablet Take 2.5 mg by mouth once a week. 4 pills once weekly      . Multiple Vitamin (MULTIVITAMIN) tablet Take 1 tablet by mouth daily.      . sertraline (ZOLOFT) 100 MG tablet Take 100 mg by mouth daily.       No current facility-administered medications for this visit.    No past medical history on file.  No past surgical history on file.  No family history on file.  History   Social History  . Marital Status: Married    Spouse Name: N/A    Number of Children: N/A  . Years of Education: N/A   Occupational History  . Not on file.   Social History Main Topics  . Smoking status: Former Games developer  . Smokeless tobacco:  Former Neurosurgeon    Quit date: 03/25/1957  . Alcohol Use: No  . Drug Use: No  . Sexual Activity: Not on file   Other Topics Concern  . Not on file   Social History Narrative  . No narrative on file    Review of systems: The patient specifically denies any chest pain at rest or with exertion, dyspnea at rest, orthopnea, paroxysmal nocturnal dyspnea, syncope, palpitations, focal neurological deficits, intermittent claudication, lower extremity edema, unexplained weight gain, cough, hemoptysis or wheezing. He has occasional dizziness when he gets out of bed  The patient also denies abdominal pain, nausea, vomiting, dysphagia, diarrhea, constipation, polyuria, polydipsia,  dysuria, hematuria, frequency, urgency, abnormal bleeding or bruising, fever, chills, unexpected weight changes, mood swings, change in skin or hair texture, change in voice quality, auditory or visual problems, allergic reactions or rashes, new musculoskeletal complaints other than usual "aches and pains".    PHYSICAL EXAM BP 116/64  Pulse 49  Resp 16  Ht 5\' 7"  (1.702 m)  Wt 200 lb (90.719 kg)  BMI 31.32 kg/m2 General: Alert, oriented x3, no distress  Head: no evidence of trauma, PERRL, EOMI, no exophtalmos or lid lag, no myxedema, no xanthelasma; normal ears, nose and oropharynx  Neck: normal jugular venous pulsations and no hepatojugular reflux; brisk carotid pulses without delay and no carotid bruits  Chest: clear to auscultation, no signs of consolidation by percussion or palpation, normal fremitus, symmetrical and full respiratory excursions  Cardiovascular: normal position and quality of the apical impulse, regular markedly bradycardic rhythm, normal first and second heart sounds, no murmurs, rubs or gallops  Abdomen: no tenderness or distention, no masses by palpation, no abnormal pulsatility or arterial bruits, normal bowel sounds, no hepatosplenomegaly  Extremities: no clubbing, cyanosis or edema; 2+ radial, ulnar and brachial pulses bilaterally; 2+ right femoral, posterior tibial and dorsalis pedis pulses; 2+ left femoral, posterior tibial and dorsalis pedis pulses; no subclavian or femoral bruits  Neurological: grossly nonfocal   Lipid Panel  No results found for this basename: chol, trig, hdl, cholhdl, vldl, ldlcalc    BMET    Component Value Date/Time   NA 139 01/13/2009 0645   K 3.9 01/13/2009 0645   CL 108 01/13/2009 0645   CO2 23 01/13/2009 0645   GLUCOSE 96 01/13/2009 0645   BUN 13 01/13/2009 0645   CREATININE 1.23 01/13/2009 0645   CALCIUM 8.8 01/13/2009 0645   GFRNONAA 57* 01/13/2009 0645   GFRAA  Value: >60        The eGFR has been calculated using the MDRD  equation. This calculation has not been validated in all clinical situations. eGFR's persistently <60 mL/min signify possible Chronic Kidney Disease. 01/13/2009 0645     ASSESSMENT AND PLAN BRADYCARDIA Miguel [name] has symptomatic GERD cardiac and marked chronotropic incompetence. I think he would greatly benefit from a quality of life 0.2 view if he receives a dual chamber permanent pacemaker. He is best suited for a device with a blended minute ventilation and accelerometer sensor. Scribed the purpose of the device, the technical details surrounding its implantation and followup, potential complications and a variety of processes and cons. I really think he would feel better if he receives the device, but he is reluctant. I gave him written literature regarding pacemakers and he agrees to think about it some more.  PULMONARY EMBOLISM The risk of recurrent deep venous thrombosis and pulmonary embolism with protracted interruption of warfarin for lumbar spine surgery should be considered relatively high  in my opinion. In fact it might be best to implant an inferior vena cava filter prior to interruption of anticoagulation. It is likely that he will have to be off warfarin for minimum of 6 weeks and will be stricken with limited mobility during that time. As we were having this discussion he told me that he has 40 decided that the risks of back surgery are not warranted and he thinks he will leave "well enough alone". I do think that with appropriate precautions he could have the back surgery. I'm not sure that an anesthesiologist would agreed to provide general anesthesia if he presents with a heart rate of 45 beats per minute and no pacemaker.   Orders Placed This Encounter  Procedures  . EKG 12-Lead   No orders of the defined types were placed in this encounter.    Junious Silk, MD, [facility] and [facility] [phone] office [phone]  pager

## 2012-12-03 NOTE — Assessment & Plan Note (Signed)
Miguel [name] clearly has symptomatic GERD cardiac and marked chronotropic incompetence. I think he would greatly benefit from a quality of life 0.2 view if he receives a dual chamber permanent pacemaker. He is best suited for a device with a blended minute ventilation and accelerometer sensor. Scribed the purpose of the device, the technical details surrounding its implantation and followup, potential complications and a variety of processes and cons. I really think he would feel better if he receives the device, but he is reluctant. I gave him written literature regarding pacemakers and he agrees to think about it some more.

## 2012-12-04 ENCOUNTER — Other Ambulatory Visit: Payer: Self-pay | Admitting: *Deleted

## 2012-12-04 DIAGNOSIS — R001 Bradycardia, unspecified: Secondary | ICD-10-CM

## 2012-12-18 DIAGNOSIS — I824Y9 Acute embolism and thrombosis of unspecified deep veins of unspecified proximal lower extremity: Secondary | ICD-10-CM | POA: Diagnosis not present

## 2012-12-18 DIAGNOSIS — I2699 Other pulmonary embolism without acute cor pulmonale: Secondary | ICD-10-CM | POA: Diagnosis not present

## 2012-12-18 DIAGNOSIS — Z7901 Long term (current) use of anticoagulants: Secondary | ICD-10-CM | POA: Diagnosis not present

## 2012-12-30 DIAGNOSIS — Z23 Encounter for immunization: Secondary | ICD-10-CM | POA: Diagnosis not present

## 2013-01-05 DIAGNOSIS — IMO0002 Reserved for concepts with insufficient information to code with codable children: Secondary | ICD-10-CM | POA: Diagnosis not present

## 2013-01-05 DIAGNOSIS — I2699 Other pulmonary embolism without acute cor pulmonale: Secondary | ICD-10-CM | POA: Diagnosis not present

## 2013-01-05 DIAGNOSIS — N189 Chronic kidney disease, unspecified: Secondary | ICD-10-CM | POA: Diagnosis not present

## 2013-01-05 DIAGNOSIS — I498 Other specified cardiac arrhythmias: Secondary | ICD-10-CM | POA: Diagnosis not present

## 2013-01-15 DIAGNOSIS — Z7901 Long term (current) use of anticoagulants: Secondary | ICD-10-CM | POA: Diagnosis not present

## 2013-01-15 DIAGNOSIS — I824Y9 Acute embolism and thrombosis of unspecified deep veins of unspecified proximal lower extremity: Secondary | ICD-10-CM | POA: Diagnosis not present

## 2013-01-15 DIAGNOSIS — I2699 Other pulmonary embolism without acute cor pulmonale: Secondary | ICD-10-CM | POA: Diagnosis not present

## 2013-02-02 DIAGNOSIS — F323 Major depressive disorder, single episode, severe with psychotic features: Secondary | ICD-10-CM | POA: Diagnosis not present

## 2013-02-02 DIAGNOSIS — Z7901 Long term (current) use of anticoagulants: Secondary | ICD-10-CM | POA: Diagnosis not present

## 2013-02-02 DIAGNOSIS — I2699 Other pulmonary embolism without acute cor pulmonale: Secondary | ICD-10-CM | POA: Diagnosis not present

## 2013-02-02 DIAGNOSIS — I824Y9 Acute embolism and thrombosis of unspecified deep veins of unspecified proximal lower extremity: Secondary | ICD-10-CM | POA: Diagnosis not present

## 2013-02-09 DIAGNOSIS — C61 Malignant neoplasm of prostate: Secondary | ICD-10-CM | POA: Diagnosis not present

## 2013-02-16 DIAGNOSIS — C61 Malignant neoplasm of prostate: Secondary | ICD-10-CM | POA: Diagnosis not present

## 2013-02-17 DIAGNOSIS — M069 Rheumatoid arthritis, unspecified: Secondary | ICD-10-CM | POA: Diagnosis not present

## 2013-02-17 DIAGNOSIS — IMO0002 Reserved for concepts with insufficient information to code with codable children: Secondary | ICD-10-CM | POA: Diagnosis not present

## 2013-02-17 DIAGNOSIS — M159 Polyosteoarthritis, unspecified: Secondary | ICD-10-CM | POA: Diagnosis not present

## 2013-03-05 DIAGNOSIS — I2699 Other pulmonary embolism without acute cor pulmonale: Secondary | ICD-10-CM | POA: Diagnosis not present

## 2013-03-05 DIAGNOSIS — Z7901 Long term (current) use of anticoagulants: Secondary | ICD-10-CM | POA: Diagnosis not present

## 2013-03-05 DIAGNOSIS — I824Y9 Acute embolism and thrombosis of unspecified deep veins of unspecified proximal lower extremity: Secondary | ICD-10-CM | POA: Diagnosis not present

## 2013-03-17 DIAGNOSIS — Z7901 Long term (current) use of anticoagulants: Secondary | ICD-10-CM | POA: Diagnosis not present

## 2013-03-17 DIAGNOSIS — I2699 Other pulmonary embolism without acute cor pulmonale: Secondary | ICD-10-CM | POA: Diagnosis not present

## 2013-03-17 DIAGNOSIS — I824Y9 Acute embolism and thrombosis of unspecified deep veins of unspecified proximal lower extremity: Secondary | ICD-10-CM | POA: Diagnosis not present

## 2013-03-30 DIAGNOSIS — M25559 Pain in unspecified hip: Secondary | ICD-10-CM | POA: Diagnosis not present

## 2013-03-30 DIAGNOSIS — M169 Osteoarthritis of hip, unspecified: Secondary | ICD-10-CM | POA: Diagnosis not present

## 2013-03-30 DIAGNOSIS — M1610 Unilateral primary osteoarthritis, unspecified hip: Secondary | ICD-10-CM | POA: Diagnosis not present

## 2013-04-08 DIAGNOSIS — M25559 Pain in unspecified hip: Secondary | ICD-10-CM | POA: Diagnosis not present

## 2013-04-14 DIAGNOSIS — I2699 Other pulmonary embolism without acute cor pulmonale: Secondary | ICD-10-CM | POA: Diagnosis not present

## 2013-04-14 DIAGNOSIS — Z7901 Long term (current) use of anticoagulants: Secondary | ICD-10-CM | POA: Diagnosis not present

## 2013-04-14 DIAGNOSIS — M25559 Pain in unspecified hip: Secondary | ICD-10-CM | POA: Diagnosis not present

## 2013-04-14 DIAGNOSIS — I824Y9 Acute embolism and thrombosis of unspecified deep veins of unspecified proximal lower extremity: Secondary | ICD-10-CM | POA: Diagnosis not present

## 2013-04-20 DIAGNOSIS — M25559 Pain in unspecified hip: Secondary | ICD-10-CM | POA: Diagnosis not present

## 2013-04-20 DIAGNOSIS — R262 Difficulty in walking, not elsewhere classified: Secondary | ICD-10-CM | POA: Diagnosis not present

## 2013-04-22 DIAGNOSIS — M25559 Pain in unspecified hip: Secondary | ICD-10-CM | POA: Diagnosis not present

## 2013-04-22 DIAGNOSIS — R262 Difficulty in walking, not elsewhere classified: Secondary | ICD-10-CM | POA: Diagnosis not present

## 2013-04-28 DIAGNOSIS — I2699 Other pulmonary embolism without acute cor pulmonale: Secondary | ICD-10-CM | POA: Diagnosis not present

## 2013-04-28 DIAGNOSIS — I824Y9 Acute embolism and thrombosis of unspecified deep veins of unspecified proximal lower extremity: Secondary | ICD-10-CM | POA: Diagnosis not present

## 2013-04-28 DIAGNOSIS — Z7901 Long term (current) use of anticoagulants: Secondary | ICD-10-CM | POA: Diagnosis not present

## 2013-05-26 DIAGNOSIS — I2699 Other pulmonary embolism without acute cor pulmonale: Secondary | ICD-10-CM | POA: Diagnosis not present

## 2013-05-26 DIAGNOSIS — Z7901 Long term (current) use of anticoagulants: Secondary | ICD-10-CM | POA: Diagnosis not present

## 2013-05-26 DIAGNOSIS — I824Y9 Acute embolism and thrombosis of unspecified deep veins of unspecified proximal lower extremity: Secondary | ICD-10-CM | POA: Diagnosis not present

## 2013-06-22 DIAGNOSIS — F323 Major depressive disorder, single episode, severe with psychotic features: Secondary | ICD-10-CM | POA: Diagnosis not present

## 2013-06-25 DIAGNOSIS — Z7901 Long term (current) use of anticoagulants: Secondary | ICD-10-CM | POA: Diagnosis not present

## 2013-06-25 DIAGNOSIS — I2699 Other pulmonary embolism without acute cor pulmonale: Secondary | ICD-10-CM | POA: Diagnosis not present

## 2013-06-25 DIAGNOSIS — I824Y9 Acute embolism and thrombosis of unspecified deep veins of unspecified proximal lower extremity: Secondary | ICD-10-CM | POA: Diagnosis not present

## 2013-07-08 DIAGNOSIS — F332 Major depressive disorder, recurrent severe without psychotic features: Secondary | ICD-10-CM | POA: Diagnosis not present

## 2013-07-13 DIAGNOSIS — I10 Essential (primary) hypertension: Secondary | ICD-10-CM | POA: Diagnosis not present

## 2013-07-13 DIAGNOSIS — I498 Other specified cardiac arrhythmias: Secondary | ICD-10-CM | POA: Diagnosis not present

## 2013-07-13 DIAGNOSIS — Z79899 Other long term (current) drug therapy: Secondary | ICD-10-CM | POA: Diagnosis not present

## 2013-07-15 DIAGNOSIS — Z7901 Long term (current) use of anticoagulants: Secondary | ICD-10-CM | POA: Diagnosis not present

## 2013-07-15 DIAGNOSIS — I2699 Other pulmonary embolism without acute cor pulmonale: Secondary | ICD-10-CM | POA: Diagnosis not present

## 2013-07-15 DIAGNOSIS — I824Y9 Acute embolism and thrombosis of unspecified deep veins of unspecified proximal lower extremity: Secondary | ICD-10-CM | POA: Diagnosis not present

## 2013-07-20 DIAGNOSIS — N189 Chronic kidney disease, unspecified: Secondary | ICD-10-CM | POA: Diagnosis not present

## 2013-07-20 DIAGNOSIS — M159 Polyosteoarthritis, unspecified: Secondary | ICD-10-CM | POA: Diagnosis not present

## 2013-07-20 DIAGNOSIS — I2699 Other pulmonary embolism without acute cor pulmonale: Secondary | ICD-10-CM | POA: Diagnosis not present

## 2013-07-20 DIAGNOSIS — M069 Rheumatoid arthritis, unspecified: Secondary | ICD-10-CM | POA: Diagnosis not present

## 2013-08-03 ENCOUNTER — Other Ambulatory Visit: Payer: Self-pay | Admitting: Dermatology

## 2013-08-03 DIAGNOSIS — C44611 Basal cell carcinoma of skin of unspecified upper limb, including shoulder: Secondary | ICD-10-CM | POA: Diagnosis not present

## 2013-08-03 DIAGNOSIS — C434 Malignant melanoma of scalp and neck: Secondary | ICD-10-CM | POA: Diagnosis not present

## 2013-08-03 DIAGNOSIS — C4441 Basal cell carcinoma of skin of scalp and neck: Secondary | ICD-10-CM | POA: Diagnosis not present

## 2013-08-03 DIAGNOSIS — C44519 Basal cell carcinoma of skin of other part of trunk: Secondary | ICD-10-CM | POA: Diagnosis not present

## 2013-08-13 DIAGNOSIS — I824Y9 Acute embolism and thrombosis of unspecified deep veins of unspecified proximal lower extremity: Secondary | ICD-10-CM | POA: Diagnosis not present

## 2013-08-13 DIAGNOSIS — Z7901 Long term (current) use of anticoagulants: Secondary | ICD-10-CM | POA: Diagnosis not present

## 2013-08-15 DIAGNOSIS — X58XXXA Exposure to other specified factors, initial encounter: Secondary | ICD-10-CM | POA: Diagnosis not present

## 2013-08-15 DIAGNOSIS — Z86718 Personal history of other venous thrombosis and embolism: Secondary | ICD-10-CM | POA: Diagnosis not present

## 2013-08-15 DIAGNOSIS — F329 Major depressive disorder, single episode, unspecified: Secondary | ICD-10-CM | POA: Diagnosis not present

## 2013-08-15 DIAGNOSIS — M25519 Pain in unspecified shoulder: Secondary | ICD-10-CM | POA: Diagnosis not present

## 2013-08-15 DIAGNOSIS — F3289 Other specified depressive episodes: Secondary | ICD-10-CM | POA: Diagnosis not present

## 2013-08-15 DIAGNOSIS — S46909A Unspecified injury of unspecified muscle, fascia and tendon at shoulder and upper arm level, unspecified arm, initial encounter: Secondary | ICD-10-CM | POA: Diagnosis not present

## 2013-08-15 DIAGNOSIS — S4980XA Other specified injuries of shoulder and upper arm, unspecified arm, initial encounter: Secondary | ICD-10-CM | POA: Diagnosis not present

## 2013-08-15 DIAGNOSIS — Z7901 Long term (current) use of anticoagulants: Secondary | ICD-10-CM | POA: Diagnosis not present

## 2013-09-10 DIAGNOSIS — I2699 Other pulmonary embolism without acute cor pulmonale: Secondary | ICD-10-CM | POA: Diagnosis not present

## 2013-09-10 DIAGNOSIS — Z7901 Long term (current) use of anticoagulants: Secondary | ICD-10-CM | POA: Diagnosis not present

## 2013-09-17 ENCOUNTER — Other Ambulatory Visit: Payer: Self-pay | Admitting: Dermatology

## 2013-09-17 DIAGNOSIS — C4359 Malignant melanoma of other part of trunk: Secondary | ICD-10-CM | POA: Diagnosis not present

## 2013-09-28 DIAGNOSIS — Z7901 Long term (current) use of anticoagulants: Secondary | ICD-10-CM | POA: Diagnosis not present

## 2013-09-28 DIAGNOSIS — I2699 Other pulmonary embolism without acute cor pulmonale: Secondary | ICD-10-CM | POA: Diagnosis not present

## 2013-10-26 DIAGNOSIS — I2699 Other pulmonary embolism without acute cor pulmonale: Secondary | ICD-10-CM | POA: Diagnosis not present

## 2013-10-26 DIAGNOSIS — Z7901 Long term (current) use of anticoagulants: Secondary | ICD-10-CM | POA: Diagnosis not present

## 2013-10-29 ENCOUNTER — Other Ambulatory Visit: Payer: Self-pay | Admitting: Dermatology

## 2013-10-29 DIAGNOSIS — C4441 Basal cell carcinoma of skin of scalp and neck: Secondary | ICD-10-CM | POA: Diagnosis not present

## 2013-11-09 DIAGNOSIS — H26499 Other secondary cataract, unspecified eye: Secondary | ICD-10-CM | POA: Diagnosis not present

## 2013-11-09 DIAGNOSIS — Z961 Presence of intraocular lens: Secondary | ICD-10-CM | POA: Diagnosis not present

## 2013-11-09 DIAGNOSIS — H353190 Nonexudative age-related macular degeneration, unspecified eye, stage unspecified: Secondary | ICD-10-CM | POA: Diagnosis not present

## 2013-11-10 DIAGNOSIS — I2699 Other pulmonary embolism without acute cor pulmonale: Secondary | ICD-10-CM | POA: Diagnosis not present

## 2013-11-10 DIAGNOSIS — Z7901 Long term (current) use of anticoagulants: Secondary | ICD-10-CM | POA: Diagnosis not present

## 2013-11-10 DIAGNOSIS — I824Y9 Acute embolism and thrombosis of unspecified deep veins of unspecified proximal lower extremity: Secondary | ICD-10-CM | POA: Diagnosis not present

## 2013-12-02 ENCOUNTER — Other Ambulatory Visit: Payer: Self-pay | Admitting: Dermatology

## 2013-12-02 DIAGNOSIS — C44519 Basal cell carcinoma of skin of other part of trunk: Secondary | ICD-10-CM | POA: Diagnosis not present

## 2013-12-02 DIAGNOSIS — C44319 Basal cell carcinoma of skin of other parts of face: Secondary | ICD-10-CM | POA: Diagnosis not present

## 2013-12-08 DIAGNOSIS — Z7901 Long term (current) use of anticoagulants: Secondary | ICD-10-CM | POA: Diagnosis not present

## 2013-12-08 DIAGNOSIS — I824Y9 Acute embolism and thrombosis of unspecified deep veins of unspecified proximal lower extremity: Secondary | ICD-10-CM | POA: Diagnosis not present

## 2013-12-08 DIAGNOSIS — I2699 Other pulmonary embolism without acute cor pulmonale: Secondary | ICD-10-CM | POA: Diagnosis not present

## 2013-12-10 DIAGNOSIS — M069 Rheumatoid arthritis, unspecified: Secondary | ICD-10-CM | POA: Diagnosis not present

## 2013-12-10 DIAGNOSIS — M159 Polyosteoarthritis, unspecified: Secondary | ICD-10-CM | POA: Diagnosis not present

## 2013-12-10 DIAGNOSIS — IMO0002 Reserved for concepts with insufficient information to code with codable children: Secondary | ICD-10-CM | POA: Diagnosis not present

## 2013-12-25 DIAGNOSIS — F323 Major depressive disorder, single episode, severe with psychotic features: Secondary | ICD-10-CM | POA: Diagnosis not present

## 2013-12-28 DIAGNOSIS — M4806 Spinal stenosis, lumbar region: Secondary | ICD-10-CM | POA: Diagnosis not present

## 2013-12-28 DIAGNOSIS — M5440 Lumbago with sciatica, unspecified side: Secondary | ICD-10-CM | POA: Diagnosis not present

## 2013-12-28 DIAGNOSIS — G894 Chronic pain syndrome: Secondary | ICD-10-CM | POA: Diagnosis not present

## 2013-12-28 DIAGNOSIS — M5417 Radiculopathy, lumbosacral region: Secondary | ICD-10-CM | POA: Diagnosis not present

## 2014-01-05 DIAGNOSIS — Z7901 Long term (current) use of anticoagulants: Secondary | ICD-10-CM | POA: Diagnosis not present

## 2014-01-05 DIAGNOSIS — Z86711 Personal history of pulmonary embolism: Secondary | ICD-10-CM | POA: Diagnosis not present

## 2014-01-12 DIAGNOSIS — Z1389 Encounter for screening for other disorder: Secondary | ICD-10-CM | POA: Diagnosis not present

## 2014-01-12 DIAGNOSIS — Z79899 Other long term (current) drug therapy: Secondary | ICD-10-CM | POA: Diagnosis not present

## 2014-01-12 DIAGNOSIS — Z0000 Encounter for general adult medical examination without abnormal findings: Secondary | ICD-10-CM | POA: Diagnosis not present

## 2014-01-12 DIAGNOSIS — M0589 Other rheumatoid arthritis with rheumatoid factor of multiple sites: Secondary | ICD-10-CM | POA: Diagnosis not present

## 2014-01-12 DIAGNOSIS — N189 Chronic kidney disease, unspecified: Secondary | ICD-10-CM | POA: Diagnosis not present

## 2014-01-12 DIAGNOSIS — I10 Essential (primary) hypertension: Secondary | ICD-10-CM | POA: Diagnosis not present

## 2014-01-15 DIAGNOSIS — F332 Major depressive disorder, recurrent severe without psychotic features: Secondary | ICD-10-CM | POA: Diagnosis not present

## 2014-02-01 DIAGNOSIS — Z86711 Personal history of pulmonary embolism: Secondary | ICD-10-CM | POA: Diagnosis not present

## 2014-02-01 DIAGNOSIS — Z7901 Long term (current) use of anticoagulants: Secondary | ICD-10-CM | POA: Diagnosis not present

## 2014-02-02 DIAGNOSIS — C44311 Basal cell carcinoma of skin of nose: Secondary | ICD-10-CM | POA: Diagnosis not present

## 2014-02-11 DIAGNOSIS — M5136 Other intervertebral disc degeneration, lumbar region: Secondary | ICD-10-CM | POA: Diagnosis not present

## 2014-02-11 DIAGNOSIS — E291 Testicular hypofunction: Secondary | ICD-10-CM | POA: Diagnosis not present

## 2014-02-11 DIAGNOSIS — E782 Mixed hyperlipidemia: Secondary | ICD-10-CM | POA: Diagnosis not present

## 2014-02-11 DIAGNOSIS — N183 Chronic kidney disease, stage 3 (moderate): Secondary | ICD-10-CM | POA: Diagnosis not present

## 2014-02-11 DIAGNOSIS — R001 Bradycardia, unspecified: Secondary | ICD-10-CM | POA: Diagnosis not present

## 2014-02-12 DIAGNOSIS — Z23 Encounter for immunization: Secondary | ICD-10-CM | POA: Diagnosis not present

## 2014-02-15 DIAGNOSIS — M5417 Radiculopathy, lumbosacral region: Secondary | ICD-10-CM | POA: Diagnosis not present

## 2014-02-15 DIAGNOSIS — G894 Chronic pain syndrome: Secondary | ICD-10-CM | POA: Diagnosis not present

## 2014-02-23 DIAGNOSIS — Z7901 Long term (current) use of anticoagulants: Secondary | ICD-10-CM | POA: Diagnosis not present

## 2014-02-23 DIAGNOSIS — Z86711 Personal history of pulmonary embolism: Secondary | ICD-10-CM | POA: Diagnosis not present

## 2014-03-10 DIAGNOSIS — C61 Malignant neoplasm of prostate: Secondary | ICD-10-CM | POA: Diagnosis not present

## 2014-04-05 DIAGNOSIS — Z7901 Long term (current) use of anticoagulants: Secondary | ICD-10-CM | POA: Diagnosis not present

## 2014-04-05 DIAGNOSIS — Z86711 Personal history of pulmonary embolism: Secondary | ICD-10-CM | POA: Diagnosis not present

## 2014-04-28 DIAGNOSIS — C61 Malignant neoplasm of prostate: Secondary | ICD-10-CM | POA: Diagnosis not present

## 2014-04-28 DIAGNOSIS — R351 Nocturia: Secondary | ICD-10-CM | POA: Diagnosis not present

## 2014-05-03 DIAGNOSIS — Z86718 Personal history of other venous thrombosis and embolism: Secondary | ICD-10-CM | POA: Diagnosis not present

## 2014-05-03 DIAGNOSIS — Z7901 Long term (current) use of anticoagulants: Secondary | ICD-10-CM | POA: Diagnosis not present

## 2014-05-14 DIAGNOSIS — F323 Major depressive disorder, single episode, severe with psychotic features: Secondary | ICD-10-CM | POA: Diagnosis not present

## 2014-05-17 DIAGNOSIS — Z86711 Personal history of pulmonary embolism: Secondary | ICD-10-CM | POA: Diagnosis not present

## 2014-05-17 DIAGNOSIS — Z7901 Long term (current) use of anticoagulants: Secondary | ICD-10-CM | POA: Diagnosis not present

## 2014-06-14 DIAGNOSIS — Z7901 Long term (current) use of anticoagulants: Secondary | ICD-10-CM | POA: Diagnosis not present

## 2014-06-14 DIAGNOSIS — Z86711 Personal history of pulmonary embolism: Secondary | ICD-10-CM | POA: Diagnosis not present

## 2014-07-12 DIAGNOSIS — Z7901 Long term (current) use of anticoagulants: Secondary | ICD-10-CM | POA: Diagnosis not present

## 2014-07-12 DIAGNOSIS — Z86711 Personal history of pulmonary embolism: Secondary | ICD-10-CM | POA: Diagnosis not present

## 2014-07-13 DIAGNOSIS — M0609 Rheumatoid arthritis without rheumatoid factor, multiple sites: Secondary | ICD-10-CM | POA: Diagnosis not present

## 2014-07-13 DIAGNOSIS — M5136 Other intervertebral disc degeneration, lumbar region: Secondary | ICD-10-CM | POA: Diagnosis not present

## 2014-07-13 DIAGNOSIS — M150 Primary generalized (osteo)arthritis: Secondary | ICD-10-CM | POA: Diagnosis not present

## 2014-07-15 DIAGNOSIS — F3341 Major depressive disorder, recurrent, in partial remission: Secondary | ICD-10-CM | POA: Diagnosis not present

## 2014-08-09 DIAGNOSIS — Z86711 Personal history of pulmonary embolism: Secondary | ICD-10-CM | POA: Diagnosis not present

## 2014-08-09 DIAGNOSIS — Z7901 Long term (current) use of anticoagulants: Secondary | ICD-10-CM | POA: Diagnosis not present

## 2014-08-25 ENCOUNTER — Other Ambulatory Visit: Payer: Self-pay | Admitting: Dermatology

## 2014-08-25 DIAGNOSIS — C44729 Squamous cell carcinoma of skin of left lower limb, including hip: Secondary | ICD-10-CM | POA: Diagnosis not present

## 2014-08-25 DIAGNOSIS — M793 Panniculitis, unspecified: Secondary | ICD-10-CM | POA: Diagnosis not present

## 2014-08-25 DIAGNOSIS — F323 Major depressive disorder, single episode, severe with psychotic features: Secondary | ICD-10-CM | POA: Diagnosis not present

## 2014-08-25 DIAGNOSIS — C44721 Squamous cell carcinoma of skin of unspecified lower limb, including hip: Secondary | ICD-10-CM | POA: Diagnosis not present

## 2014-09-09 DIAGNOSIS — Z7901 Long term (current) use of anticoagulants: Secondary | ICD-10-CM | POA: Diagnosis not present

## 2014-09-09 DIAGNOSIS — Z86718 Personal history of other venous thrombosis and embolism: Secondary | ICD-10-CM | POA: Diagnosis not present

## 2014-09-21 DIAGNOSIS — C44729 Squamous cell carcinoma of skin of left lower limb, including hip: Secondary | ICD-10-CM | POA: Diagnosis not present

## 2014-09-28 DIAGNOSIS — S81802A Unspecified open wound, left lower leg, initial encounter: Secondary | ICD-10-CM | POA: Diagnosis not present

## 2014-10-12 DIAGNOSIS — S81802D Unspecified open wound, left lower leg, subsequent encounter: Secondary | ICD-10-CM | POA: Diagnosis not present

## 2014-10-14 DIAGNOSIS — Z86718 Personal history of other venous thrombosis and embolism: Secondary | ICD-10-CM | POA: Diagnosis not present

## 2014-10-14 DIAGNOSIS — Z7901 Long term (current) use of anticoagulants: Secondary | ICD-10-CM | POA: Diagnosis not present

## 2014-10-26 DIAGNOSIS — S81802D Unspecified open wound, left lower leg, subsequent encounter: Secondary | ICD-10-CM | POA: Diagnosis not present

## 2014-11-10 DIAGNOSIS — H59093 Other disorders of the eye following cataract surgery, bilateral: Secondary | ICD-10-CM | POA: Diagnosis not present

## 2014-11-10 DIAGNOSIS — H43399 Other vitreous opacities, unspecified eye: Secondary | ICD-10-CM | POA: Diagnosis not present

## 2014-11-10 DIAGNOSIS — H43819 Vitreous degeneration, unspecified eye: Secondary | ICD-10-CM | POA: Diagnosis not present

## 2014-11-10 DIAGNOSIS — H5201 Hypermetropia, right eye: Secondary | ICD-10-CM | POA: Diagnosis not present

## 2014-11-10 DIAGNOSIS — H52223 Regular astigmatism, bilateral: Secondary | ICD-10-CM | POA: Diagnosis not present

## 2014-11-11 DIAGNOSIS — Z86718 Personal history of other venous thrombosis and embolism: Secondary | ICD-10-CM | POA: Diagnosis not present

## 2014-11-11 DIAGNOSIS — Z7901 Long term (current) use of anticoagulants: Secondary | ICD-10-CM | POA: Diagnosis not present

## 2014-11-23 DIAGNOSIS — S81802D Unspecified open wound, left lower leg, subsequent encounter: Secondary | ICD-10-CM | POA: Diagnosis not present

## 2014-11-23 DIAGNOSIS — L929 Granulomatous disorder of the skin and subcutaneous tissue, unspecified: Secondary | ICD-10-CM | POA: Diagnosis not present

## 2014-11-25 DIAGNOSIS — Z86711 Personal history of pulmonary embolism: Secondary | ICD-10-CM | POA: Diagnosis not present

## 2014-11-25 DIAGNOSIS — Z7901 Long term (current) use of anticoagulants: Secondary | ICD-10-CM | POA: Diagnosis not present

## 2014-12-09 DIAGNOSIS — Z86711 Personal history of pulmonary embolism: Secondary | ICD-10-CM | POA: Diagnosis not present

## 2014-12-09 DIAGNOSIS — Z7901 Long term (current) use of anticoagulants: Secondary | ICD-10-CM | POA: Diagnosis not present

## 2014-12-20 DIAGNOSIS — R001 Bradycardia, unspecified: Secondary | ICD-10-CM | POA: Diagnosis not present

## 2014-12-20 DIAGNOSIS — K439 Ventral hernia without obstruction or gangrene: Secondary | ICD-10-CM | POA: Diagnosis not present

## 2014-12-20 DIAGNOSIS — E782 Mixed hyperlipidemia: Secondary | ICD-10-CM | POA: Diagnosis not present

## 2014-12-20 DIAGNOSIS — M150 Primary generalized (osteo)arthritis: Secondary | ICD-10-CM | POA: Diagnosis not present

## 2014-12-20 DIAGNOSIS — Z23 Encounter for immunization: Secondary | ICD-10-CM | POA: Diagnosis not present

## 2014-12-23 DIAGNOSIS — Z86711 Personal history of pulmonary embolism: Secondary | ICD-10-CM | POA: Diagnosis not present

## 2014-12-23 DIAGNOSIS — Z7901 Long term (current) use of anticoagulants: Secondary | ICD-10-CM | POA: Diagnosis not present

## 2014-12-30 DIAGNOSIS — M5136 Other intervertebral disc degeneration, lumbar region: Secondary | ICD-10-CM | POA: Diagnosis not present

## 2014-12-30 DIAGNOSIS — M150 Primary generalized (osteo)arthritis: Secondary | ICD-10-CM | POA: Diagnosis not present

## 2014-12-30 DIAGNOSIS — M0609 Rheumatoid arthritis without rheumatoid factor, multiple sites: Secondary | ICD-10-CM | POA: Diagnosis not present

## 2015-01-20 DIAGNOSIS — Z86711 Personal history of pulmonary embolism: Secondary | ICD-10-CM | POA: Diagnosis not present

## 2015-01-20 DIAGNOSIS — Z7901 Long term (current) use of anticoagulants: Secondary | ICD-10-CM | POA: Diagnosis not present

## 2015-01-31 DIAGNOSIS — M150 Primary generalized (osteo)arthritis: Secondary | ICD-10-CM | POA: Diagnosis not present

## 2015-01-31 DIAGNOSIS — M0609 Rheumatoid arthritis without rheumatoid factor, multiple sites: Secondary | ICD-10-CM | POA: Diagnosis not present

## 2015-01-31 DIAGNOSIS — M5136 Other intervertebral disc degeneration, lumbar region: Secondary | ICD-10-CM | POA: Diagnosis not present

## 2015-02-09 DIAGNOSIS — F323 Major depressive disorder, single episode, severe with psychotic features: Secondary | ICD-10-CM | POA: Diagnosis not present

## 2015-02-14 DIAGNOSIS — Z7901 Long term (current) use of anticoagulants: Secondary | ICD-10-CM | POA: Diagnosis not present

## 2015-02-14 DIAGNOSIS — Z86711 Personal history of pulmonary embolism: Secondary | ICD-10-CM | POA: Diagnosis not present

## 2015-02-22 DIAGNOSIS — M8580 Other specified disorders of bone density and structure, unspecified site: Secondary | ICD-10-CM | POA: Diagnosis not present

## 2015-02-22 DIAGNOSIS — E782 Mixed hyperlipidemia: Secondary | ICD-10-CM | POA: Diagnosis not present

## 2015-02-22 DIAGNOSIS — M150 Primary generalized (osteo)arthritis: Secondary | ICD-10-CM | POA: Diagnosis not present

## 2015-02-22 DIAGNOSIS — M0609 Rheumatoid arthritis without rheumatoid factor, multiple sites: Secondary | ICD-10-CM | POA: Diagnosis not present

## 2015-02-22 DIAGNOSIS — R001 Bradycardia, unspecified: Secondary | ICD-10-CM | POA: Diagnosis not present

## 2015-02-23 ENCOUNTER — Other Ambulatory Visit: Payer: Self-pay | Admitting: Dermatology

## 2015-02-23 DIAGNOSIS — I872 Venous insufficiency (chronic) (peripheral): Secondary | ICD-10-CM | POA: Diagnosis not present

## 2015-02-23 DIAGNOSIS — B078 Other viral warts: Secondary | ICD-10-CM | POA: Diagnosis not present

## 2015-02-23 DIAGNOSIS — L820 Inflamed seborrheic keratosis: Secondary | ICD-10-CM | POA: Diagnosis not present

## 2015-02-23 DIAGNOSIS — D485 Neoplasm of uncertain behavior of skin: Secondary | ICD-10-CM | POA: Diagnosis not present

## 2015-02-28 DIAGNOSIS — N183 Chronic kidney disease, stage 3 (moderate): Secondary | ICD-10-CM | POA: Diagnosis not present

## 2015-02-28 DIAGNOSIS — E782 Mixed hyperlipidemia: Secondary | ICD-10-CM | POA: Diagnosis not present

## 2015-02-28 DIAGNOSIS — M859 Disorder of bone density and structure, unspecified: Secondary | ICD-10-CM | POA: Diagnosis not present

## 2015-02-28 DIAGNOSIS — R001 Bradycardia, unspecified: Secondary | ICD-10-CM | POA: Diagnosis not present

## 2015-02-28 DIAGNOSIS — M150 Primary generalized (osteo)arthritis: Secondary | ICD-10-CM | POA: Diagnosis not present

## 2015-02-28 DIAGNOSIS — M8580 Other specified disorders of bone density and structure, unspecified site: Secondary | ICD-10-CM | POA: Diagnosis not present

## 2015-03-07 DIAGNOSIS — Z7901 Long term (current) use of anticoagulants: Secondary | ICD-10-CM | POA: Diagnosis not present

## 2015-03-07 DIAGNOSIS — Z86711 Personal history of pulmonary embolism: Secondary | ICD-10-CM | POA: Diagnosis not present

## 2015-03-17 DIAGNOSIS — Z7901 Long term (current) use of anticoagulants: Secondary | ICD-10-CM | POA: Diagnosis not present

## 2015-03-17 DIAGNOSIS — Z86711 Personal history of pulmonary embolism: Secondary | ICD-10-CM | POA: Diagnosis not present

## 2015-03-20 DIAGNOSIS — Z7901 Long term (current) use of anticoagulants: Secondary | ICD-10-CM | POA: Diagnosis not present

## 2015-03-20 DIAGNOSIS — S0990XA Unspecified injury of head, initial encounter: Secondary | ICD-10-CM | POA: Diagnosis not present

## 2015-03-20 DIAGNOSIS — S0101XA Laceration without foreign body of scalp, initial encounter: Secondary | ICD-10-CM | POA: Diagnosis not present

## 2015-03-20 DIAGNOSIS — R58 Hemorrhage, not elsewhere classified: Secondary | ICD-10-CM | POA: Diagnosis not present

## 2015-03-20 DIAGNOSIS — R51 Headache: Secondary | ICD-10-CM | POA: Diagnosis not present

## 2015-03-21 DIAGNOSIS — R52 Pain, unspecified: Secondary | ICD-10-CM | POA: Diagnosis not present

## 2015-03-21 DIAGNOSIS — Z86711 Personal history of pulmonary embolism: Secondary | ICD-10-CM | POA: Diagnosis not present

## 2015-03-21 DIAGNOSIS — Z7901 Long term (current) use of anticoagulants: Secondary | ICD-10-CM | POA: Diagnosis not present

## 2015-03-21 DIAGNOSIS — Z760 Encounter for issue of repeat prescription: Secondary | ICD-10-CM | POA: Diagnosis not present

## 2015-03-29 ENCOUNTER — Ambulatory Visit (INDEPENDENT_AMBULATORY_CARE_PROVIDER_SITE_OTHER): Payer: Medicare Other | Admitting: Physician Assistant

## 2015-03-29 VITALS — BP 138/52 | HR 64 | Temp 97.8°F | Resp 16

## 2015-03-29 DIAGNOSIS — S0101XA Laceration without foreign body of scalp, initial encounter: Secondary | ICD-10-CM

## 2015-03-29 NOTE — Progress Notes (Signed)
[facility] [address] [place] [address] [phone]  Date:  03/29/2015   Name:  Miguel [name]   DOB:  05-12-1931   MRN:  [id number]  PCP:  [name], MD    Chief Complaint: Suture / Staple Removal   History of Present Illness:  This is a 82 y.o. male with PMH PE, RA, depression, dizziness who is presenting for staple removal. He was out of town for Christmas with his family. On Christmas day he tripped over a rug and fell directly onto his head. He was seen in an ED. CT head was reportedly negative. He is here with his daughter who reports they placed #21 staple and #1 simple suture. He reports he is doing well. No significant pain. No drainage, fever or chills. They have been washing the wound daily with soap and water. Daughter is worried about the adherent dried blood. Pt is on coumadin for PE. Last had INR checked 1.5 weeks ago and in normal range. Goes to have INR checked again in 4 weeks. Denies AMS, confusion, dizziness, weakness, numbness, blurred vision.  Review of Systems:  Review of Systems See HPI  Patient Active Problem List   Diagnosis Date Noted  . BRADYCARDIA 07/19/2008  . DIZZINESS 07/19/2008  . FATIGUE 07/19/2008  . Shortness of breath 07/19/2008  . DEPRESSION 07/15/2008  . PULMONARY EMBOLISM 07/15/2008  . DVT 07/15/2008  . ARTHRITIS, RHEUMATOID 07/15/2008    Prior to Admission medications   Medication Sig Start Date End Date Taking? Authorizing Provider  ALPRAZolam Duanne Moron) 0.25 MG tablet Take 0.25 mg by mouth at bedtime as needed for sleep.   Yes Historical Provider, MD  ARIPiprazole (ABILIFY) 2 MG tablet Take 1 mg by mouth daily.   Yes Historical Provider, MD  calcium-vitamin D (OSCAL WITH D) 500-200 MG-UNIT per tablet Take 1 tablet by mouth daily with breakfast.   Yes Historical Provider, MD  Cholecalciferol (VITAMIN D3) 2000 UNITS TABS Take by mouth daily.   Yes Historical Provider, MD  HYDROcodone-acetaminophen  (NORCO/VICODIN) 5-325 MG per tablet as needed. 10/28/12  Yes Historical Provider, MD  lithium carbonate 150 MG capsule Take 150 mg by mouth 2 (two) times daily. 09/22/12  Yes Historical Provider, MD  Multiple Vitamin (MULTIVITAMIN) tablet Take 1 tablet by mouth daily.   Yes Historical Provider, MD  sertraline (ZOLOFT) 100 MG tablet Take 100 mg by mouth daily. 10/13/12  Yes Historical Provider, MD                         Allergies  Allergen Reactions  . Ibuprofen   . Oxaprozin   . Phenelzine Sulfate     REACTION: if taken with certain foods or other medications can cause an allergic reaction    Past Surgical History  Procedure Laterality Date  . Prostate surgery    . Spine surgery      Social History  Substance Use Topics  . Smoking status: Former Research scientist (life sciences)  . Smokeless tobacco: Former Systems developer    Quit date: 03/25/1957  . Alcohol Use: No    No family history on file.  Medication list has been reviewed and updated.  Physical Examination:  Physical Exam  Constitutional: He is oriented to person, place, and time. He appears well-developed and well-nourished. No distress.  HENT:  Head: Normocephalic and atraumatic.  Right Ear: Hearing normal.  Left Ear: Hearing normal.  Nose: Nose normal.  Eyes: Conjunctivae and lids are normal. Right  eye exhibits no discharge. Left eye exhibits no discharge. No scleral icterus.  Pulmonary/Chest: Effort normal. No respiratory distress.  Musculoskeletal: Normal range of motion.  Neurological: He is alert and oriented to person, place, and time.  Skin: Skin is warm and dry.  Large laceration on top of scalp and adherent dried blood. Wound cleansed with soap and water. Some dried blood removed. #20 staples removed and #1 suture removed. Tolerated well. 3-4 cm small hematoma noted on left side of head. Skin with bluish hue over hematoma. Not ttp.  Psychiatric: He has a normal mood and affect. His speech is normal and behavior is normal. Thought content  normal.   BP 138/52 mmHg  Pulse 64  Temp(Src) 97.8 F (36.6 C)  Resp 16  SpO2 99%  Assessment and Plan:  1. Laceration of scalp, initial encounter #20 staples and #1 suture removed. Daughter thinks there were #21 staples but after thorough searching, did not find another. They will wash with soap and water a couple times a day to remove adherent dried blood. Offered to do xray to find find staple - they decided to return if another staple is found.   Benjaman Pott Drenda Freeze, MHS [facility]  03/29/2015

## 2015-03-31 ENCOUNTER — Ambulatory Visit (INDEPENDENT_AMBULATORY_CARE_PROVIDER_SITE_OTHER): Payer: Medicare Other | Admitting: Physician Assistant

## 2015-03-31 VITALS — BP 120/60 | HR 50 | Temp 97.8°F | Resp 16 | Ht 67.0 in | Wt 190.0 lb

## 2015-03-31 DIAGNOSIS — S0101XD Laceration without foreign body of scalp, subsequent encounter: Secondary | ICD-10-CM | POA: Diagnosis not present

## 2015-03-31 NOTE — Patient Instructions (Signed)
We found the 20th staple and removed it!  You may remove the dressing and replace it if needed, if 24 hours. Do not wash the hair for 3 days, but check the wound each day to look for increasing redness, swelling and tenderness.  If it continues to bleed in 3 days, we may need to consider re-closing it with sutures or staples.

## 2015-04-01 NOTE — Progress Notes (Signed)
Patient ID: Miguel [name], male    DOB: 14-Aug-1931, 82 y.o.   MRN: [id number]  PCP: [name], MD  Chief Complaint  Patient presents with  . Wound Check  . Suture / Staple Removal    Subjective:  HPI This very pleasant 82 year-old gentleman presents today for evaluation of a scalp laceration sustained on 12/25 while visiting his family in [place]. He tripped on a rug and fell in his head. CT reportedly negative for intracranial injury, and large V-shaped laceration was closed with staples and one suture. He did well following the injury and presented here for staple/suture removal on 1/03, accompanied by his daughter.  #20 staples and #1 suture were removed, though the daughter said that #21 were placed. Thorough searching did not reveal another staple. He declined an xray to locate the staple. They were advised to wash the wound with soap and water to remove the large amount of dried blood from the wound (the patient is on warfarin due to PE).  He presents today due to bleeding from the wound that began last night. No repeat injury. No increased pain. No increased swelling or redness reported (but he is alone for today's visit). His wife, a retired Therapist, sports, dressed the wound and he presents for evaluation.  No HA, dizziness. No pain at the wound site. No fever/chills.   Review of Systems  Constitutional: Negative for fever and chills.  Skin: Positive for wound.  Neurological: Negative for dizziness, light-headedness and headaches.       Patient Active Problem List   Diagnosis Date Noted  . BRADYCARDIA 07/19/2008  . DIZZINESS 07/19/2008  . FATIGUE 07/19/2008  . Shortness of breath 07/19/2008  . DEPRESSION 07/15/2008  . PULMONARY EMBOLISM 07/15/2008  . DVT 07/15/2008  . ARTHRITIS, RHEUMATOID 07/15/2008    Allergies  Allergen Reactions  . Ibuprofen   . Oxaprozin   . Phenelzine Sulfate     REACTION: if taken with certain foods or other medications can cause  an allergic reaction    Prior to Admission medications   Medication Sig Start Date End Date Taking? Authorizing Provider  ALPRAZolam Duanne Moron) 0.25 MG tablet Take 0.25 mg by mouth at bedtime as needed for sleep.   Yes Historical Provider, MD  ARIPiprazole (ABILIFY) 2 MG tablet Take 1 mg by mouth daily.   Yes Historical Provider, MD  calcium-vitamin D (OSCAL WITH D) 500-200 MG-UNIT per tablet Take 1 tablet by mouth daily with breakfast.   Yes Historical Provider, MD  Cholecalciferol (VITAMIN D3) 2000 UNITS TABS Take by mouth daily.   Yes Historical Provider, MD  Ferrous Sulfate (IRON) 90 (18 FE) MG TABS Take by mouth daily. Reported on 03/29/2015   Yes Historical Provider, MD  HYDROcodone-acetaminophen (NORCO/VICODIN) 5-325 MG per tablet as needed. 10/28/12  Yes Historical Provider, MD  JANTOVEN 5 MG tablet Take 5 mg by mouth as directed. Reported on 03/29/2015 11/08/12  Yes Historical Provider, MD  lithium carbonate 150 MG capsule Take 150 mg by mouth 2 (two) times daily. 09/22/12  Yes Historical Provider, MD  methotrexate (RHEUMATREX) 2.5 MG tablet Take 2.5 mg by mouth once a week. Reported on 03/29/2015 10/31/12  Yes Historical Provider, MD  Multiple Vitamin (MULTIVITAMIN) tablet Take 1 tablet by mouth daily.   Yes Historical Provider, MD  sertraline (ZOLOFT) 100 MG tablet Take 100 mg by mouth daily. 10/13/12  Yes Historical Provider, MD     Past Medical, Surgical Family and Social History reviewed and updated.  Objective:  Physical Exam  Constitutional: He is oriented to person, place, and time. He appears well-developed and well-nourished. He is active and cooperative. No distress (but frail appearing, using cane for ambulation).  BP 120/60 mmHg  Pulse 50  Temp(Src) 97.8 F (36.6 C) (Oral)  Resp 16  Ht 5\' 7"  (1.702 m)  Wt 190 lb (86.183 kg)  BMI 29.75 kg/m2  SpO2 98%   HENT:  Head: Normocephalic. Head is with laceration.    Bandage removed. Dried blood cleaned using saline and  gauze, revealing previously un-located staple. The medial "arm" of the wound is not yet well-healed, consistent with the presence of a large amount of blood. Hematoma persists under the lateral aspect of the wound, which is very well-healed. No erythema. No induration. Minimally tender. Rebandaged.  Eyes: Conjunctivae are normal.  Pulmonary/Chest: Effort normal.  Neurological: He is alert and oriented to person, place, and time.  Psychiatric: He has a normal mood and affect. His speech is normal and behavior is normal.           Assessment & Plan:  1. Laceration of scalp, subsequent encounter Remaining staple removed. I suspect that the clotted blood has delayed the healing and vigorous washing caused the bleeding last night. Advised to hold off on washing his hair for a few days, but to check the wound 1-2 times each day, looking for development of redness, swelling or purulence, or pain. If he has continued bleeding at that point, would recommend re-anesthetizing the area locally, removal of the hematoma and repeat closure with sutures.   Fara Chute, PA-C Physician Assistant-Certified [facility]

## 2015-04-04 NOTE — Progress Notes (Signed)
  Medical screening examination/treatment/procedure(s) were performed by non-physician practitioner and as supervising physician I was immediately available for consultation/collaboration.     

## 2015-04-14 DIAGNOSIS — Z7901 Long term (current) use of anticoagulants: Secondary | ICD-10-CM | POA: Diagnosis not present

## 2015-04-14 DIAGNOSIS — Z86718 Personal history of other venous thrombosis and embolism: Secondary | ICD-10-CM | POA: Diagnosis not present

## 2015-05-09 DIAGNOSIS — Z79899 Other long term (current) drug therapy: Secondary | ICD-10-CM | POA: Diagnosis not present

## 2015-05-09 DIAGNOSIS — M150 Primary generalized (osteo)arthritis: Secondary | ICD-10-CM | POA: Diagnosis not present

## 2015-05-09 DIAGNOSIS — R5383 Other fatigue: Secondary | ICD-10-CM | POA: Diagnosis not present

## 2015-05-09 DIAGNOSIS — M0609 Rheumatoid arthritis without rheumatoid factor, multiple sites: Secondary | ICD-10-CM | POA: Diagnosis not present

## 2015-05-09 DIAGNOSIS — M5136 Other intervertebral disc degeneration, lumbar region: Secondary | ICD-10-CM | POA: Diagnosis not present

## 2015-05-12 DIAGNOSIS — D649 Anemia, unspecified: Secondary | ICD-10-CM | POA: Diagnosis not present

## 2015-06-07 DIAGNOSIS — M150 Primary generalized (osteo)arthritis: Secondary | ICD-10-CM | POA: Diagnosis not present

## 2015-06-07 DIAGNOSIS — M5136 Other intervertebral disc degeneration, lumbar region: Secondary | ICD-10-CM | POA: Diagnosis not present

## 2015-06-07 DIAGNOSIS — M0609 Rheumatoid arthritis without rheumatoid factor, multiple sites: Secondary | ICD-10-CM | POA: Diagnosis not present

## 2015-06-07 DIAGNOSIS — Z79899 Other long term (current) drug therapy: Secondary | ICD-10-CM | POA: Diagnosis not present

## 2015-06-07 DIAGNOSIS — D649 Anemia, unspecified: Secondary | ICD-10-CM | POA: Diagnosis not present

## 2015-06-14 DIAGNOSIS — Z86718 Personal history of other venous thrombosis and embolism: Secondary | ICD-10-CM | POA: Diagnosis not present

## 2015-06-14 DIAGNOSIS — Z7901 Long term (current) use of anticoagulants: Secondary | ICD-10-CM | POA: Diagnosis not present

## 2015-06-16 DIAGNOSIS — D649 Anemia, unspecified: Secondary | ICD-10-CM | POA: Diagnosis not present

## 2015-06-28 DIAGNOSIS — I872 Venous insufficiency (chronic) (peripheral): Secondary | ICD-10-CM | POA: Diagnosis not present

## 2015-06-28 DIAGNOSIS — L820 Inflamed seborrheic keratosis: Secondary | ICD-10-CM | POA: Diagnosis not present

## 2015-06-28 DIAGNOSIS — R600 Localized edema: Secondary | ICD-10-CM | POA: Diagnosis not present

## 2015-07-12 DIAGNOSIS — M25461 Effusion, right knee: Secondary | ICD-10-CM | POA: Diagnosis not present

## 2015-07-12 DIAGNOSIS — Z79899 Other long term (current) drug therapy: Secondary | ICD-10-CM | POA: Diagnosis not present

## 2015-07-12 DIAGNOSIS — M25462 Effusion, left knee: Secondary | ICD-10-CM | POA: Diagnosis not present

## 2015-07-12 DIAGNOSIS — M5136 Other intervertebral disc degeneration, lumbar region: Secondary | ICD-10-CM | POA: Diagnosis not present

## 2015-07-12 DIAGNOSIS — M0609 Rheumatoid arthritis without rheumatoid factor, multiple sites: Secondary | ICD-10-CM | POA: Diagnosis not present

## 2015-07-12 DIAGNOSIS — M150 Primary generalized (osteo)arthritis: Secondary | ICD-10-CM | POA: Diagnosis not present

## 2015-07-12 DIAGNOSIS — M25569 Pain in unspecified knee: Secondary | ICD-10-CM | POA: Diagnosis not present

## 2015-07-19 DIAGNOSIS — Z7901 Long term (current) use of anticoagulants: Secondary | ICD-10-CM | POA: Diagnosis not present

## 2015-07-19 DIAGNOSIS — Z86718 Personal history of other venous thrombosis and embolism: Secondary | ICD-10-CM | POA: Diagnosis not present

## 2015-08-09 DIAGNOSIS — M0609 Rheumatoid arthritis without rheumatoid factor, multiple sites: Secondary | ICD-10-CM | POA: Diagnosis not present

## 2015-08-09 DIAGNOSIS — M5136 Other intervertebral disc degeneration, lumbar region: Secondary | ICD-10-CM | POA: Diagnosis not present

## 2015-08-09 DIAGNOSIS — Z79899 Other long term (current) drug therapy: Secondary | ICD-10-CM | POA: Diagnosis not present

## 2015-08-09 DIAGNOSIS — M150 Primary generalized (osteo)arthritis: Secondary | ICD-10-CM | POA: Diagnosis not present

## 2015-08-16 DIAGNOSIS — Z86711 Personal history of pulmonary embolism: Secondary | ICD-10-CM | POA: Diagnosis not present

## 2015-08-16 DIAGNOSIS — Z7901 Long term (current) use of anticoagulants: Secondary | ICD-10-CM | POA: Diagnosis not present

## 2015-09-01 DIAGNOSIS — F323 Major depressive disorder, single episode, severe with psychotic features: Secondary | ICD-10-CM | POA: Diagnosis not present

## 2015-09-12 DIAGNOSIS — D649 Anemia, unspecified: Secondary | ICD-10-CM | POA: Diagnosis not present

## 2015-09-12 DIAGNOSIS — E782 Mixed hyperlipidemia: Secondary | ICD-10-CM | POA: Diagnosis not present

## 2015-09-12 DIAGNOSIS — R001 Bradycardia, unspecified: Secondary | ICD-10-CM | POA: Diagnosis not present

## 2015-09-12 DIAGNOSIS — N183 Chronic kidney disease, stage 3 (moderate): Secondary | ICD-10-CM | POA: Diagnosis not present

## 2015-09-19 DIAGNOSIS — Z86718 Personal history of other venous thrombosis and embolism: Secondary | ICD-10-CM | POA: Diagnosis not present

## 2015-09-19 DIAGNOSIS — Z7901 Long term (current) use of anticoagulants: Secondary | ICD-10-CM | POA: Diagnosis not present

## 2015-09-28 DIAGNOSIS — L570 Actinic keratosis: Secondary | ICD-10-CM | POA: Diagnosis not present

## 2015-09-28 DIAGNOSIS — L821 Other seborrheic keratosis: Secondary | ICD-10-CM | POA: Diagnosis not present

## 2015-09-28 DIAGNOSIS — D239 Other benign neoplasm of skin, unspecified: Secondary | ICD-10-CM | POA: Diagnosis not present

## 2015-10-11 DIAGNOSIS — M150 Primary generalized (osteo)arthritis: Secondary | ICD-10-CM | POA: Diagnosis not present

## 2015-10-11 DIAGNOSIS — M5136 Other intervertebral disc degeneration, lumbar region: Secondary | ICD-10-CM | POA: Diagnosis not present

## 2015-10-11 DIAGNOSIS — Z79899 Other long term (current) drug therapy: Secondary | ICD-10-CM | POA: Diagnosis not present

## 2015-10-11 DIAGNOSIS — M0609 Rheumatoid arthritis without rheumatoid factor, multiple sites: Secondary | ICD-10-CM | POA: Diagnosis not present

## 2015-10-18 DIAGNOSIS — Z86711 Personal history of pulmonary embolism: Secondary | ICD-10-CM | POA: Diagnosis not present

## 2015-10-18 DIAGNOSIS — Z7901 Long term (current) use of anticoagulants: Secondary | ICD-10-CM | POA: Diagnosis not present

## 2015-11-15 DIAGNOSIS — Z7901 Long term (current) use of anticoagulants: Secondary | ICD-10-CM | POA: Diagnosis not present

## 2015-11-15 DIAGNOSIS — Z86711 Personal history of pulmonary embolism: Secondary | ICD-10-CM | POA: Diagnosis not present

## 2015-11-17 DIAGNOSIS — Z7901 Long term (current) use of anticoagulants: Secondary | ICD-10-CM | POA: Diagnosis not present

## 2015-11-17 DIAGNOSIS — M150 Primary generalized (osteo)arthritis: Secondary | ICD-10-CM | POA: Diagnosis not present

## 2015-11-17 DIAGNOSIS — Z886 Allergy status to analgesic agent status: Secondary | ICD-10-CM | POA: Diagnosis not present

## 2015-11-17 DIAGNOSIS — Z79899 Other long term (current) drug therapy: Secondary | ICD-10-CM | POA: Diagnosis not present

## 2015-11-17 DIAGNOSIS — Z86711 Personal history of pulmonary embolism: Secondary | ICD-10-CM | POA: Diagnosis not present

## 2015-11-17 DIAGNOSIS — M0579 Rheumatoid arthritis with rheumatoid factor of multiple sites without organ or systems involvement: Secondary | ICD-10-CM | POA: Diagnosis not present

## 2015-11-21 ENCOUNTER — Other Ambulatory Visit: Payer: Self-pay

## 2015-11-24 DIAGNOSIS — Z7901 Long term (current) use of anticoagulants: Secondary | ICD-10-CM | POA: Diagnosis not present

## 2015-11-24 DIAGNOSIS — Z86711 Personal history of pulmonary embolism: Secondary | ICD-10-CM | POA: Diagnosis not present

## 2015-12-01 DIAGNOSIS — Z23 Encounter for immunization: Secondary | ICD-10-CM | POA: Diagnosis not present

## 2015-12-01 DIAGNOSIS — Z86718 Personal history of other venous thrombosis and embolism: Secondary | ICD-10-CM | POA: Diagnosis not present

## 2015-12-01 DIAGNOSIS — Z7901 Long term (current) use of anticoagulants: Secondary | ICD-10-CM | POA: Diagnosis not present

## 2015-12-01 DIAGNOSIS — Z86711 Personal history of pulmonary embolism: Secondary | ICD-10-CM | POA: Diagnosis not present

## 2015-12-15 DIAGNOSIS — Z7901 Long term (current) use of anticoagulants: Secondary | ICD-10-CM | POA: Diagnosis not present

## 2015-12-15 DIAGNOSIS — Z86711 Personal history of pulmonary embolism: Secondary | ICD-10-CM | POA: Diagnosis not present

## 2016-01-03 DIAGNOSIS — E782 Mixed hyperlipidemia: Secondary | ICD-10-CM | POA: Diagnosis not present

## 2016-01-03 DIAGNOSIS — R001 Bradycardia, unspecified: Secondary | ICD-10-CM | POA: Diagnosis not present

## 2016-01-03 DIAGNOSIS — N183 Chronic kidney disease, stage 3 (moderate): Secondary | ICD-10-CM | POA: Diagnosis not present

## 2016-01-10 DIAGNOSIS — N183 Chronic kidney disease, stage 3 (moderate): Secondary | ICD-10-CM | POA: Diagnosis not present

## 2016-01-10 DIAGNOSIS — R001 Bradycardia, unspecified: Secondary | ICD-10-CM | POA: Diagnosis not present

## 2016-01-10 DIAGNOSIS — M0609 Rheumatoid arthritis without rheumatoid factor, multiple sites: Secondary | ICD-10-CM | POA: Diagnosis not present

## 2016-01-10 DIAGNOSIS — E782 Mixed hyperlipidemia: Secondary | ICD-10-CM | POA: Diagnosis not present

## 2016-01-12 DIAGNOSIS — M5136 Other intervertebral disc degeneration, lumbar region: Secondary | ICD-10-CM | POA: Diagnosis not present

## 2016-01-12 DIAGNOSIS — Z79899 Other long term (current) drug therapy: Secondary | ICD-10-CM | POA: Diagnosis not present

## 2016-01-12 DIAGNOSIS — M150 Primary generalized (osteo)arthritis: Secondary | ICD-10-CM | POA: Diagnosis not present

## 2016-01-12 DIAGNOSIS — M0609 Rheumatoid arthritis without rheumatoid factor, multiple sites: Secondary | ICD-10-CM | POA: Diagnosis not present

## 2016-01-23 DIAGNOSIS — Z7901 Long term (current) use of anticoagulants: Secondary | ICD-10-CM | POA: Diagnosis not present

## 2016-01-23 DIAGNOSIS — Z86711 Personal history of pulmonary embolism: Secondary | ICD-10-CM | POA: Diagnosis not present

## 2016-02-23 DIAGNOSIS — Z7901 Long term (current) use of anticoagulants: Secondary | ICD-10-CM | POA: Diagnosis not present

## 2016-02-23 DIAGNOSIS — Z86711 Personal history of pulmonary embolism: Secondary | ICD-10-CM | POA: Diagnosis not present

## 2016-02-28 DIAGNOSIS — D239 Other benign neoplasm of skin, unspecified: Secondary | ICD-10-CM | POA: Diagnosis not present

## 2016-02-28 DIAGNOSIS — L821 Other seborrheic keratosis: Secondary | ICD-10-CM | POA: Diagnosis not present

## 2016-02-28 DIAGNOSIS — I872 Venous insufficiency (chronic) (peripheral): Secondary | ICD-10-CM | POA: Diagnosis not present

## 2016-03-07 DIAGNOSIS — F323 Major depressive disorder, single episode, severe with psychotic features: Secondary | ICD-10-CM | POA: Diagnosis not present

## 2016-03-22 DIAGNOSIS — D649 Anemia, unspecified: Secondary | ICD-10-CM | POA: Diagnosis not present

## 2016-03-22 DIAGNOSIS — Z7901 Long term (current) use of anticoagulants: Secondary | ICD-10-CM | POA: Diagnosis not present

## 2016-03-22 DIAGNOSIS — Z86711 Personal history of pulmonary embolism: Secondary | ICD-10-CM | POA: Diagnosis not present

## 2016-03-28 DIAGNOSIS — F332 Major depressive disorder, recurrent severe without psychotic features: Secondary | ICD-10-CM | POA: Diagnosis not present

## 2016-03-28 DIAGNOSIS — Z79899 Other long term (current) drug therapy: Secondary | ICD-10-CM | POA: Diagnosis not present

## 2016-04-03 DIAGNOSIS — R110 Nausea: Secondary | ICD-10-CM | POA: Diagnosis not present

## 2016-04-03 DIAGNOSIS — R197 Diarrhea, unspecified: Secondary | ICD-10-CM | POA: Diagnosis not present

## 2016-04-03 DIAGNOSIS — D649 Anemia, unspecified: Secondary | ICD-10-CM | POA: Diagnosis not present

## 2016-04-03 DIAGNOSIS — N183 Chronic kidney disease, stage 3 (moderate): Secondary | ICD-10-CM | POA: Diagnosis not present

## 2016-04-04 ENCOUNTER — Other Ambulatory Visit: Payer: Self-pay | Admitting: Internal Medicine

## 2016-04-04 DIAGNOSIS — K861 Other chronic pancreatitis: Secondary | ICD-10-CM

## 2016-04-05 ENCOUNTER — Other Ambulatory Visit: Payer: Self-pay | Admitting: Internal Medicine

## 2016-04-05 DIAGNOSIS — Z7901 Long term (current) use of anticoagulants: Secondary | ICD-10-CM | POA: Diagnosis not present

## 2016-04-05 DIAGNOSIS — K859 Acute pancreatitis without necrosis or infection, unspecified: Secondary | ICD-10-CM

## 2016-04-05 DIAGNOSIS — Z86711 Personal history of pulmonary embolism: Secondary | ICD-10-CM | POA: Diagnosis not present

## 2016-04-05 DIAGNOSIS — R112 Nausea with vomiting, unspecified: Secondary | ICD-10-CM

## 2016-04-06 ENCOUNTER — Ambulatory Visit
Admission: RE | Admit: 2016-04-06 | Discharge: 2016-04-06 | Disposition: A | Discharge status: Home / Self Care | Payer: Medicare Other | Source: Ambulatory Visit | Attending: Internal Medicine | Admitting: Internal Medicine

## 2016-04-06 DIAGNOSIS — R112 Nausea with vomiting, unspecified: Secondary | ICD-10-CM

## 2016-04-06 DIAGNOSIS — K859 Acute pancreatitis without necrosis or infection, unspecified: Secondary | ICD-10-CM

## 2016-04-06 DIAGNOSIS — K5790 Diverticulosis of intestine, part unspecified, without perforation or abscess without bleeding: Secondary | ICD-10-CM | POA: Diagnosis not present

## 2016-04-06 MED ORDER — IOPAMIDOL (ISOVUE-300) INJECTION 61%
60.0000 mL | Freq: Once | INTRAVENOUS | Status: AC | PRN
  Administered 2016-04-06: 60 mL via INTRAVENOUS

## 2016-04-12 ENCOUNTER — Other Ambulatory Visit: Payer: Medicare Other

## 2016-04-17 DIAGNOSIS — M5136 Other intervertebral disc degeneration, lumbar region: Secondary | ICD-10-CM | POA: Diagnosis not present

## 2016-04-17 DIAGNOSIS — M150 Primary generalized (osteo)arthritis: Secondary | ICD-10-CM | POA: Diagnosis not present

## 2016-04-17 DIAGNOSIS — Z79899 Other long term (current) drug therapy: Secondary | ICD-10-CM | POA: Diagnosis not present

## 2016-04-17 DIAGNOSIS — M0609 Rheumatoid arthritis without rheumatoid factor, multiple sites: Secondary | ICD-10-CM | POA: Diagnosis not present

## 2016-04-18 DIAGNOSIS — D229 Melanocytic nevi, unspecified: Secondary | ICD-10-CM | POA: Diagnosis not present

## 2016-04-23 ENCOUNTER — Other Ambulatory Visit: Payer: Self-pay | Admitting: Dermatology

## 2016-04-23 DIAGNOSIS — D492 Neoplasm of unspecified behavior of bone, soft tissue, and skin: Secondary | ICD-10-CM | POA: Diagnosis not present

## 2016-04-23 DIAGNOSIS — Z85820 Personal history of malignant melanoma of skin: Secondary | ICD-10-CM | POA: Diagnosis not present

## 2016-04-23 DIAGNOSIS — L821 Other seborrheic keratosis: Secondary | ICD-10-CM | POA: Diagnosis not present

## 2016-04-23 DIAGNOSIS — L820 Inflamed seborrheic keratosis: Secondary | ICD-10-CM | POA: Diagnosis not present

## 2016-04-23 DIAGNOSIS — L409 Psoriasis, unspecified: Secondary | ICD-10-CM | POA: Diagnosis not present

## 2016-05-03 DIAGNOSIS — Z7901 Long term (current) use of anticoagulants: Secondary | ICD-10-CM | POA: Diagnosis not present

## 2016-05-03 DIAGNOSIS — Z86718 Personal history of other venous thrombosis and embolism: Secondary | ICD-10-CM | POA: Diagnosis not present

## 2016-05-15 DIAGNOSIS — Z23 Encounter for immunization: Secondary | ICD-10-CM | POA: Diagnosis not present

## 2016-05-15 DIAGNOSIS — E782 Mixed hyperlipidemia: Secondary | ICD-10-CM | POA: Diagnosis not present

## 2016-05-15 DIAGNOSIS — N183 Chronic kidney disease, stage 3 (moderate): Secondary | ICD-10-CM | POA: Diagnosis not present

## 2016-05-15 DIAGNOSIS — Z79899 Other long term (current) drug therapy: Secondary | ICD-10-CM | POA: Diagnosis not present

## 2016-05-15 DIAGNOSIS — R001 Bradycardia, unspecified: Secondary | ICD-10-CM | POA: Diagnosis not present

## 2016-05-15 DIAGNOSIS — Z0000 Encounter for general adult medical examination without abnormal findings: Secondary | ICD-10-CM | POA: Diagnosis not present

## 2016-05-22 DIAGNOSIS — D649 Anemia, unspecified: Secondary | ICD-10-CM | POA: Diagnosis not present

## 2016-05-22 DIAGNOSIS — E782 Mixed hyperlipidemia: Secondary | ICD-10-CM | POA: Diagnosis not present

## 2016-05-22 DIAGNOSIS — M150 Primary generalized (osteo)arthritis: Secondary | ICD-10-CM | POA: Diagnosis not present

## 2016-05-22 DIAGNOSIS — N183 Chronic kidney disease, stage 3 (moderate): Secondary | ICD-10-CM | POA: Diagnosis not present

## 2016-05-31 ENCOUNTER — Encounter: Payer: Self-pay | Admitting: Oncology

## 2016-05-31 ENCOUNTER — Telehealth: Payer: Self-pay | Admitting: Oncology

## 2016-05-31 DIAGNOSIS — Z86718 Personal history of other venous thrombosis and embolism: Secondary | ICD-10-CM | POA: Diagnosis not present

## 2016-05-31 DIAGNOSIS — Z7901 Long term (current) use of anticoagulants: Secondary | ICD-10-CM | POA: Diagnosis not present

## 2016-05-31 DIAGNOSIS — Z86711 Personal history of pulmonary embolism: Secondary | ICD-10-CM | POA: Diagnosis not present

## 2016-05-31 NOTE — Telephone Encounter (Signed)
Spoke patient this morning re new patient appointment for 06/05/2016 @ 11 am with Dr. Thana Farr. Patient given date/time/location/instructions. Demographic and insurance information confirmed.   Received message from HIM re patient wanting to change appointment time on 3/13 to 2 pm. Time changed, message left for patient and new schedule mailed.

## 2016-06-04 ENCOUNTER — Encounter: Payer: Self-pay | Admitting: Oncology

## 2016-06-05 ENCOUNTER — Ambulatory Visit: Payer: Medicare Other

## 2016-06-05 ENCOUNTER — Ambulatory Visit (HOSPITAL_BASED_OUTPATIENT_CLINIC_OR_DEPARTMENT_OTHER): Payer: Medicare Other | Admitting: Oncology

## 2016-06-05 VITALS — BP 146/52 | HR 50 | Temp 98.6°F | Resp 18 | Wt 189.0 lb

## 2016-06-05 DIAGNOSIS — N183 Chronic kidney disease, stage 3 unspecified: Secondary | ICD-10-CM

## 2016-06-05 DIAGNOSIS — Z86711 Personal history of pulmonary embolism: Secondary | ICD-10-CM | POA: Diagnosis not present

## 2016-06-05 DIAGNOSIS — D7589 Other specified diseases of blood and blood-forming organs: Secondary | ICD-10-CM

## 2016-06-05 DIAGNOSIS — M069 Rheumatoid arthritis, unspecified: Secondary | ICD-10-CM

## 2016-06-05 DIAGNOSIS — Z86718 Personal history of other venous thrombosis and embolism: Secondary | ICD-10-CM | POA: Diagnosis not present

## 2016-06-05 DIAGNOSIS — D649 Anemia, unspecified: Secondary | ICD-10-CM

## 2016-06-05 DIAGNOSIS — D519 Vitamin B12 deficiency anemia, unspecified: Secondary | ICD-10-CM

## 2016-06-05 LAB — CBC WITH DIFFERENTIAL/PLATELET
BASO%: 0.4 % (ref 0.0–2.0)
Basophils Absolute: 0 10*3/uL (ref 0.0–0.1)
EOS%: 1.8 % (ref 0.0–7.0)
Eosinophils Absolute: 0.1 10*3/uL (ref 0.0–0.5)
HCT: 28.6 % — ABNORMAL LOW (ref 38.4–49.9)
HGB: 9.1 g/dL — ABNORMAL LOW (ref 13.0–17.1)
LYMPH%: 18.9 % (ref 14.0–49.0)
MCH: 31.8 pg (ref 27.2–33.4)
MCHC: 31.8 g/dL — ABNORMAL LOW (ref 32.0–36.0)
MCV: 100 fL — ABNORMAL HIGH (ref 79.3–98.0)
MONO#: 0.6 10*3/uL (ref 0.1–0.9)
MONO%: 7.4 % (ref 0.0–14.0)
NEUT#: 5.4 10*3/uL (ref 1.5–6.5)
NEUT%: 71.5 % (ref 39.0–75.0)
Platelets: 181 10*3/uL (ref 140–400)
RBC: 2.86 10*6/uL — ABNORMAL LOW (ref 4.20–5.82)
RDW: 14.4 % (ref 11.0–14.6)
WBC: 7.6 10*3/uL (ref 4.0–10.3)
lymph#: 1.4 10*3/uL (ref 0.9–3.3)
nRBC: 0 % (ref 0–0)

## 2016-06-05 LAB — MORPHOLOGY: PLT EST: ADEQUATE

## 2016-06-05 LAB — COMPREHENSIVE METABOLIC PANEL
ALBUMIN: 3.8 g/dL (ref 3.5–5.0)
ALK PHOS: 50 U/L (ref 40–150)
ALT: 14 U/L (ref 0–55)
AST: 23 U/L (ref 5–34)
Anion Gap: 7 mEq/L (ref 3–11)
BUN: 17.9 mg/dL (ref 7.0–26.0)
CALCIUM: 9.4 mg/dL (ref 8.4–10.4)
CO2: 24 mEq/L (ref 22–29)
Chloride: 107 mEq/L (ref 98–109)
Creatinine: 1.5 mg/dL — ABNORMAL HIGH (ref 0.7–1.3)
EGFR: 44 mL/min/{1.73_m2} — ABNORMAL LOW (ref >90)
GLUCOSE: 97 mg/dL (ref 70–140)
POTASSIUM: 4.8 meq/L (ref 3.5–5.1)
SODIUM: 139 meq/L (ref 136–145)
Total Bilirubin: 0.29 mg/dL (ref 0.20–1.20)
Total Protein: 6.9 g/dL (ref 6.4–8.3)

## 2016-06-06 LAB — IRON AND TIBC
%SAT: 18 % — ABNORMAL LOW (ref 20–55)
IRON: 54 ug/dL (ref 42–163)
TIBC: 301 ug/dL (ref 202–409)
UIBC: 247 ug/dL (ref 117–376)

## 2016-06-06 LAB — ERYTHROPOIETIN: ERYTHROPOIETIN: 28.3 m[IU]/mL — AB (ref 2.6–18.5)

## 2016-06-06 LAB — FERRITIN: Ferritin: 36 ng/ml (ref 22–316)

## 2016-06-06 LAB — ANTINUCLEAR ANTIBODIES, IFA: ANTINUCLEAR ANTIBODIES, IFA: NEGATIVE

## 2016-06-06 LAB — VITAMIN B12: VITAMIN B 12: 365 pg/mL (ref 232–1245)

## 2016-06-06 NOTE — Progress Notes (Signed)
[facility] Initial Visit:  Patient Care Team: [name], MD as PCP - General (Internal Medicine)  CHIEF COMPLAINTS/PURPOSE OF CONSULTATION:   Evaluation of anemia.  No history exists.    HISTORY OF PRESENTING ILLNESS: Miguel [name] 83 y.o. male is here for  evaluation of anemia.  Patient has  history of  seronegative rheumatoid arthritis, chronic depression, anxiety, and history of DVT and pulmonary embolism.. It appears patient has unprovoked DVT and pulmonary embolism.  He has been treated in the past with methotrexate for his rheumatoid arthritis. However his methotrexate has been stopped.  Patient continued to be anemic hence he was advised to have a hematology evaluation. Patient denies any recent blood loss. He denies bright red blood per rectum, hematochezia and hematuria  At present  patient however feels very tired and fatigued. He denies shortness of breath of breath at rest however complains of having shortness of breath on  minimal to moderate exertion.  I reviewed the laboratory data performed on  05/15/2016 has revealed WBC of 8.6, hemoglobin 9.7 g, MCV of 102.7 with RDW of 14.2%. His CMP also has revealed elevation of BUN  at 21 mg/dL with creatinine of 1.5 mg/dL.  Review of Systems  Constitutional: Positive for fatigue. Negative for appetite change, chills, diaphoresis, fever and unexpected weight change.  HENT:   Negative for hearing loss, lump/mass, mouth sores, nosebleeds, sore throat, tinnitus, trouble swallowing and voice change.   Eyes: Negative for eye problems and icterus.  Respiratory: Positive for shortness of breath (On exertion). Negative for chest tightness, cough, hemoptysis and wheezing.   Cardiovascular: Negative for chest pain, leg swelling and palpitations.  Gastrointestinal: Negative for abdominal distention, abdominal pain, blood in stool, constipation, diarrhea, nausea, rectal pain and vomiting.       Patient did  not have a colonoscopy in recent times.  Genitourinary: Negative for bladder incontinence, difficulty urinating, dysuria, frequency, hematuria and nocturia.   Musculoskeletal: Positive for arthralgias. Negative for flank pain, myalgias, neck pain and neck stiffness.  Skin: Negative for itching, rash and wound.  Neurological: Negative for dizziness, extremity weakness, headaches, light-headedness, numbness, seizures and speech difficulty.  Hematological: Negative for adenopathy. Bruises/bleeds easily.  Psychiatric/Behavioral: Positive for depression (Patient has a history of chronic depression however at present time feels better.). Negative for confusion, decreased concentration and sleep disturbance. The patient is not nervous/anxious.     MEDICAL HISTORY: Past Medical History:  Diagnosis Date  . Arthritis   . Cancer ([place])   . Clotting disorder ([place])   . Depression     SURGICAL HISTORY: Past Surgical History:  Procedure Laterality Date  . PROSTATE SURGERY    . SPINE SURGERY      SOCIAL HISTORY: Social History   Social History  . Marital status: Married    Spouse name: N/A  . Number of children: N/A  . Years of education: N/A   Occupational History  . Not on file.   Social History Main Topics  . Smoking status: Former Research scientist (life sciences)  . Smokeless tobacco: Former Systems developer    Quit date: 03/25/1957  . Alcohol use No  . Drug use: No  . Sexual activity: Not on file   Other Topics Concern  . Not on file   Social History Narrative  . No narrative on file    FAMILY HISTORY History reviewed. No pertinent family history.  ALLERGIES:  is allergic to ibuprofen; oxaprozin; and phenelzine sulfate.  MEDICATIONS:  Current Outpatient Prescriptions  Medication Sig  Dispense Refill  . traMADol (ULTRAM) 50 MG tablet Take 50 mg by mouth.    . ALPRAZolam (XANAX) 0.25 MG tablet Take 0.25 mg by mouth at bedtime as needed for sleep.    . ARIPiprazole (ABILIFY) 2 MG tablet Take 1 mg by mouth  daily.    . calcium-vitamin D (OSCAL WITH D) 500-200 MG-UNIT per tablet Take 1 tablet by mouth daily with breakfast.    . Cholecalciferol (VITAMIN D3) 2000 UNITS TABS Take by mouth daily.    . Ferrous Sulfate (IRON) 90 (18 FE) MG TABS Take by mouth daily. Reported on 03/29/2015    . JANTOVEN 5 MG tablet Take 5 mg by mouth as directed. Reported on 03/29/2015    . lithium carbonate 150 MG capsule Take 150 mg by mouth 2 (two) times daily.    . Multiple Vitamin (MULTIVITAMIN) tablet Take 1 tablet by mouth daily.    . predniSONE (DELTASONE) 2.5 MG tablet TAKE 1 TABLET BY MOUTH WITH FOOD OR MILK EVERY DAY  1  . sertraline (ZOLOFT) 100 MG tablet Take 100 mg by mouth daily.    . warfarin (COUMADIN) 5 MG tablet Take by mouth.     No current facility-administered medications for this visit.     PHYSICAL EXAMINATION:  ECOG PERFORMANCE STATUS: 0 - Asymptomatic   Vitals:   06/05/16 1352  BP: (!) 146/52  Pulse: (!) 50  Resp: 18  Temp: 98.6 F (37 C)    Filed Weights   06/05/16 1352  Weight: 189 lb (85.7 kg)     Physical Exam  Constitutional: He is oriented to person, place, and time and well-developed, well-nourished, and in no distress. No distress.  HENT:  Head: Normocephalic and atraumatic.  Nose: Nose normal.  Mouth/Throat: Oropharynx is clear and moist. No oropharyngeal exudate.  Eyes: Conjunctivae and EOM are normal. Pupils are equal, round, and reactive to light. Right eye exhibits no discharge. Left eye exhibits no discharge. No scleral icterus.  Neck: Neck supple. No JVD present. No tracheal deviation present. No thyromegaly present.  Cardiovascular: Normal rate, regular rhythm and normal heart sounds.  Exam reveals no gallop and no friction rub.   No murmur heard. Pulmonary/Chest: Breath sounds normal. No stridor. No respiratory distress. He has no wheezes. He has no rales. He exhibits no tenderness.  Abdominal: Soft. Bowel sounds are normal. He exhibits no distension and no  mass. There is no tenderness. There is no rebound and no guarding.  Musculoskeletal: He exhibits no tenderness.  Lymphadenopathy:    He has no cervical adenopathy.  Neurological: He is alert and oriented to person, place, and time.  Skin: Skin is warm. No rash noted. No erythema. No pallor.   Multiple bruises are seen on both upper and lower extremities.   LABORATORY DATA: I have personally reviewed the data as listed:  Appointment on 06/05/2016  Component Date Value Ref Range Status  . WBC 06/05/2016 7.6  4.0 - 10.3 10e3/uL Final  . NEUT# 06/05/2016 5.4  1.5 - 6.5 10e3/uL Final  . HGB 06/05/2016 9.1* 13.0 - 17.1 g/dL Final  . HCT 06/05/2016 28.6* 38.4 - 49.9 % Final  . Platelets 06/05/2016 181  140 - 400 10e3/uL Final  . MCV 06/05/2016 100.0* 79.3 - 98.0 fL Final  . MCH 06/05/2016 31.8  27.2 - 33.4 pg Final  . MCHC 06/05/2016 31.8* 32.0 - 36.0 g/dL Final  . RBC 06/05/2016 2.86* 4.20 - 5.82 10e6/uL Final  . RDW 06/05/2016 14.4  11.0 -   14.6 % Final  . lymph# 06/05/2016 1.4  0.9 - 3.3 10e3/uL Final  . MONO# 06/05/2016 0.6  0.1 - 0.9 10e3/uL Final  . Eosinophils Absolute 06/05/2016 0.1  0.0 - 0.5 10e3/uL Final  . Basophils Absolute 06/05/2016 0.0  0.0 - 0.1 10e3/uL Final  . NEUT% 06/05/2016 71.5  39.0 - 75.0 % Final  . LYMPH% 06/05/2016 18.9  14.0 - 49.0 % Final  . MONO% 06/05/2016 7.4  0.0 - 14.0 % Final  . EOS% 06/05/2016 1.8  0.0 - 7.0 % Final  . BASO% 06/05/2016 0.4  0.0 - 2.0 % Final  . nRBC 06/05/2016 0  0 - 0 % Final  . Sodium 06/05/2016 139  136 - 145 mEq/L Final  . Potassium 06/05/2016 4.8  3.5 - 5.1 mEq/L Final  . Chloride 06/05/2016 107  98 - 109 mEq/L Final  . CO2 06/05/2016 24  22 - 29 mEq/L Final  . Glucose 06/05/2016 97  70 - 140 mg/dl Final  . BUN 06/05/2016 17.9  7.0 - 26.0 mg/dL Final  . Creatinine 06/05/2016 1.5* 0.7 - 1.3 mg/dL Final  . Total Bilirubin 06/05/2016 0.29  0.20 - 1.20 mg/dL Final  . Alkaline Phosphatase 06/05/2016 50  40 - 150 U/L Final  . AST  06/05/2016 23  5 - 34 U/L Final  . ALT 06/05/2016 14  0 - 55 U/L Final  . Total Protein 06/05/2016 6.9  6.4 - 8.3 g/dL Final  . Albumin 06/05/2016 3.8  3.5 - 5.0 g/dL Final  . Calcium 06/05/2016 9.4  8.4 - 10.4 mg/dL Final  . Anion Gap 06/05/2016 7  3 - 11 mEq/L Final  . EGFR 06/05/2016 44* >90 ml/min/1.73 m2 Final  . Vitamin B12 06/05/2016 365  232 - 1,245 pg/mL Final  . Ferritin 06/05/2016 36  22 - 316 ng/ml Final  . Iron 06/05/2016 54  42 - 163 ug/dL Final  . TIBC 06/05/2016 301  202 - 409 ug/dL Final  . UIBC 06/05/2016 247  117 - 376 ug/dL Final  . %SAT 06/05/2016 18* 20 - 55 % Final  Office Visit on 06/05/2016  Component Date Value Ref Range Status  . Ovalocytes 06/05/2016 Few  Negative Final  . White Cell Comments 06/05/2016 C/W auto diff   Final  . PLT EST 06/05/2016 Adequate  Adequate Final    RADIOGRAPHIC STUDIES: I have personally reviewed the radiological images as listed and agree with the findings in the report  No results found.  ASSESSMENT/PLAN 1 History of seronegative rheumatoid arthritis 2. History of DVT and a pulmonary embolism (unprovoked ) 3. Chronic depression and anxiety. 5. Macrocytic anemia 6. 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 7. History of mixed hyperlipidemia. 8. Chronic renal insufficiency stage III     Cancer Staging No matching staging information was found for the patient.   No problem-specific Assessment & Plan notes found for this encounter.   Orders Placed This Encounter  Procedures  . CBC with Differential    Standing Status:   Future    Number of Occurrences:   1    Standing Expiration Date:   06/05/2017  . Save smear  . Comprehensive metabolic panel    Standing Status:   Future    Number of Occurrences:   1    Standing Expiration Date:   06/05/2017  . Erythropoietin    Standing Status:   Future    Number of Occurrences:   1    Standing Expiration Date:     06/05/2017  . Vitamin B12    Standing Status:    Future    Number of Occurrences:   1    Standing Expiration Date:   06/05/2017  . Methylmalonic acid, serum    Standing Status:   Future    Number of Occurrences:   1    Standing Expiration Date:   06/05/2017  . ANA, IFA (with reflex)    Standing Status:   Future    Number of Occurrences:   1    Standing Expiration Date:   06/05/2017  . Ferritin    Standing Status:   Future    Number of Occurrences:   1    Standing Expiration Date:   06/05/2017  . Iron and TIBC    Standing Status:   Future    Number of Occurrences:   1    Standing Expiration Date:   06/05/2017  . Morphology    Derel C [name] is a 84-year-old Caucasian gentleman was recently diagnosed with a macrocytic anemia. Patient has no obvious evidence of blood loss either through GI or urinary system.  Based on his CBC evaluation patient has macrocytosis. Patient at this time also has been taking iron supplements by mouth.  I recommended sending further tests including repeat CBC with manual differentiation, erythropoietin level, CMP, B-12 level, ANA, ferritin and iron panel.  I scheduled a follow-up appointment in 5-7 days with the above test results. I would make further recommendations based on the above test results.  I would like to thank again his primary care provider for giving me the opportunity to participate In his care.   All questions were answered. The patient knows to call the clinic with any problems, questions or concerns.  This note was electronically signed.     Reddy , MD  06/06/2016 10:44 AM 

## 2016-06-07 LAB — METHYLMALONIC ACID, SERUM: Methylmalonic Acid, Serum: 288 nmol/L (ref 0–378)

## 2016-06-12 ENCOUNTER — Ambulatory Visit (HOSPITAL_BASED_OUTPATIENT_CLINIC_OR_DEPARTMENT_OTHER): Payer: Medicare Other | Admitting: Oncology

## 2016-06-12 ENCOUNTER — Encounter: Payer: Self-pay | Admitting: Oncology

## 2016-06-12 ENCOUNTER — Telehealth: Payer: Self-pay | Admitting: Oncology

## 2016-06-12 ENCOUNTER — Ambulatory Visit (HOSPITAL_BASED_OUTPATIENT_CLINIC_OR_DEPARTMENT_OTHER): Payer: Medicare Other

## 2016-06-12 VITALS — BP 145/55 | HR 50 | Temp 98.2°F | Resp 18 | Wt 186.1 lb

## 2016-06-12 DIAGNOSIS — Z86718 Personal history of other venous thrombosis and embolism: Secondary | ICD-10-CM | POA: Diagnosis not present

## 2016-06-12 DIAGNOSIS — D649 Anemia, unspecified: Secondary | ICD-10-CM | POA: Diagnosis not present

## 2016-06-12 DIAGNOSIS — M069 Rheumatoid arthritis, unspecified: Secondary | ICD-10-CM

## 2016-06-12 DIAGNOSIS — N183 Chronic kidney disease, stage 3 (moderate): Secondary | ICD-10-CM | POA: Diagnosis not present

## 2016-06-12 LAB — RETICULOCYTES (CHCC)
Immature Retic Fract: 11.1 % — ABNORMAL HIGH (ref 3.00–10.60)
RBC: 2.84 10*6/uL — ABNORMAL LOW (ref 4.20–5.82)
RETIC CT ABS: 63.05 10*3/uL (ref 34.80–93.90)
Retic %: 2.22 % — ABNORMAL HIGH (ref 0.80–1.80)

## 2016-06-12 LAB — LACTATE DEHYDROGENASE: LDH: 158 U/L (ref 125–245)

## 2016-06-12 NOTE — Telephone Encounter (Signed)
Appointments scheduled per 3.20.18 LOS. Patient given AVS report and calendars with future scheduled appointments.  °

## 2016-06-12 NOTE — Progress Notes (Signed)
[facility] Follow up:    Miguel [name], [place] [address]      DIAGNOSIS:   1 History of seronegative rheumatoid arthritis 2. History of DVT and a pulmonary embolism (unprovoked ) 3. Chronic depression and anxiety. 5. Macrocytic anemia 6. 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 7. History of mixed hyperlipidemia. 8. Chronic renal insufficiency stage III   CURRENT THERAPY:  INTERVAL HISTORY: Miguel [name] 83 y.o. male returns for    HISTORY OF PRESENTING ILLNESS: Miguel [name] 83 y.o. male is here for   did do a follow-up visit.  I had the opportunity to see him for initial consultation on the March 13 of 2018 regarding evaluation of her chronic anemia.    Patient has  history of  seronegative rheumatoid arthritis, chronic depression, anxiety, and history of DVT and pulmonary embolism.. It appears patient has unprovoked DVT and pulmonary embolism.  He has been treated in the past with methotrexate for his rheumatoid arthritis. However his methotrexate has been stopped. Patient continued to be anemic.   I did send further blood tests regarding evaluation of anemia. I reviewed the laboratory did results with patient. It was negative. B12 was within normal range. Iron panel showed borderline low iron saturation with a normal iron and ferritin levels.   His repeat hemoglobin was at 9 g. Time patient still feels tired however denies shortness of breath or chest pain. Patient is accompanied by his wife to the clinic.  Patient denies any recent blood loss. He denies bright red blood per rectum, hematochezia and hematuria     Patient Active Problem List   Diagnosis Date Noted  . BRADYCARDIA 07/19/2008  . DIZZINESS 07/19/2008  . FATIGUE 07/19/2008  . Shortness of breath 07/19/2008  . DEPRESSION 07/15/2008  . PULMONARY EMBOLISM 07/15/2008  . DVT 07/15/2008  . ARTHRITIS,  RHEUMATOID 07/15/2008    is allergic to ibuprofen; oxaprozin; and phenelzine sulfate.  MEDICAL HISTORY: Past Medical History:  Diagnosis Date  . Arthritis   . Cancer ([facility])   . Clotting disorder ([facility])   . Depression     SURGICAL HISTORY: Past Surgical History:  Procedure Laterality Date  . PROSTATE SURGERY    . SPINE SURGERY      SOCIAL HISTORY: Social History   Social History  . Marital status: Married    Spouse name: N/A  . Number of children: N/A  . Years of education: N/A   Occupational History  . Not on file.   Social History Main Topics  . Smoking status: Former Research scientist (life sciences)  . Smokeless tobacco: Former Systems developer    Quit date: 03/25/1957  . Alcohol use No  . Drug use: No  . Sexual activity: Not on file   Other Topics Concern  . Not on file   Social History Narrative  . No narrative on file    FAMILY HISTORY: History reviewed. No pertinent family history.  Review of Systems  Constitutional: Negative for appetite change, chills, diaphoresis and unexpected weight change.  HENT:   Negative for hearing loss, lump/mass, mouth sores, nosebleeds, sore throat, tinnitus, trouble swallowing and voice change.   Eyes: Negative for icterus.  Respiratory: Negative for chest tightness, cough, hemoptysis, shortness of breath and wheezing.   Cardiovascular: Negative for chest pain, leg swelling and palpitations.  Gastrointestinal: Negative for abdominal distention, abdominal pain, blood in stool, constipation, diarrhea, rectal pain and vomiting.  Genitourinary: Negative for bladder incontinence, difficulty urinating, discharge, dyspareunia,  dysuria, frequency, hematuria, nocturia and pelvic pain.   Musculoskeletal: Negative for arthralgias, back pain, flank pain, gait problem, myalgias and neck pain.  Skin: Negative for rash.  Neurological: Negative for dizziness, extremity weakness, gait problem, headaches, light-headedness, numbness, seizures and speech difficulty.   Hematological: Negative for adenopathy. Does not bruise/bleed easily.  Psychiatric/Behavioral: Negative for confusion, decreased concentration, depression, sleep disturbance and suicidal ideas. The patient is not nervous/anxious.       PHYSICAL EXAMINATION  ECOG PERFORMANCE STATUS: 0 - Asymptomatic  Vitals:   06/12/16 1417  BP: (!) 145/55  Pulse: (!) 50  Resp: 18  Temp: 98.2 F (36.8 C)    Physical Exam   I have not performed a physical examination today.  LABORATORY DATA:  Reviewed all the laboratory data with patient and his wife.  CBC    Component Value Date/Time   WBC 7.6 06/05/2016 1536   WBC 8.2 01/13/2009 0645   RBC 2.84 (L) 06/12/2016 1513   RBC 3.59 (L) 01/13/2009 0645   HGB 9.1 (L) 06/05/2016 1536   HCT 28.6 (L) 06/05/2016 1536   PLT 181 06/05/2016 1536   MCV 100.0 (H) 06/05/2016 1536   MCH 31.8 06/05/2016 1536   MCHC 31.8 (L) 06/05/2016 1536   MCHC 33.9 01/13/2009 0645   RDW 14.4 06/05/2016 1536   LYMPHSABS 1.4 06/05/2016 1536   MONOABS 0.6 06/05/2016 1536   EOSABS 0.1 06/05/2016 1536   BASOSABS 0.0 06/05/2016 1536    CMP     Component Value Date/Time   NA 139 06/05/2016 1537   K 4.8 06/05/2016 1537   CL 108 01/13/2009 0645   CO2 24 06/05/2016 1537   GLUCOSE 97 06/05/2016 1537   BUN 17.9 06/05/2016 1537   CREATININE 1.5 (H) 06/05/2016 1537   CALCIUM 9.4 06/05/2016 1537   PROT 6.9 06/05/2016 1537   ALBUMIN 3.8 06/05/2016 1537   AST 23 06/05/2016 1537   ALT 14 06/05/2016 1537   ALKPHOS 50 06/05/2016 1537   BILITOT 0.29 06/05/2016 1537   GFRNONAA 57 (L) 01/13/2009 0645   GFRAA  01/13/2009 0645    >60        The eGFR has been calculated using the MDRD equation. This calculation has not been validated in all clinical situations. eGFR's persistently <60 mL/min signify possible Chronic Kidney Disease.      ASSESSMENT and THERAPY PLAN:   1 History of seronegative rheumatoid arthritis 2. History of DVT and a pulmonary embolism  (unprovoked ) 3. Chronic depression and anxiety. 5. Macrocytic anemia 6. 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 7. History of mixed hyperlipidemia. 8. Chronic renal insufficiency stage III  Miguel [name],  83 year old gentleman who has been diagnosed with macrocytic anemia. He is currently on iron supplements 3 times a week. However his  hemoglobin failed to improve.  I have discussed with patient at this time, it could be related to underlying hematological disorder such as myelodysplastic syndrome. I have recommended to have a bone marrow biopsy. However patient wishes to wait another 3-4 weeks. Patient wishes to continue  iron supplements for the time being. I have advised him to call our office in between if  patient develops  more fatigue, shortness of breath or chest pain.  I also recommended sending further tests including SPEP, serum free light chains, LDH, reticulocyte count and direct antiglobulin test.      Orders Placed This Encounter  Procedures  . Lactate dehydrogenase (LDH)    Standing Status:   Future  Number of Occurrences:   1    Standing Expiration Date:   06/22/2017  . Reticulocytes    Standing Status:   Future    Number of Occurrences:   1    Standing Expiration Date:   06/20/2017  . SPEP (Serum protein electrophoresis)    Standing Status:   Future    Number of Occurrences:   1    Standing Expiration Date:   06/22/2017  . Kappa/lambda light chains    Standing Status:   Future    Number of Occurrences:   1    Standing Expiration Date:   06/07/2017  . CBC with Differential    Standing Status:   Future    Standing Expiration Date:   06/03/2017  . Direct antiglobulin test ([facility])    Standing Status:   Future    Number of Occurrences:   1    Standing Expiration Date:   06/22/2017    All questions were answered. The patient knows to call the clinic with any problems, questions or concerns. We can certainly see the patient much sooner if  necessary. This note was electronically signed. Judge Stall, MD 06/12/2016

## 2016-06-13 LAB — PROTEIN ELECTROPHORESIS, SERUM
A/G Ratio: 1.1 (ref 0.7–1.7)
ALPHA 1: 0.2 g/dL (ref 0.0–0.4)
ALPHA 2: 0.8 g/dL (ref 0.4–1.0)
Albumin: 3.4 g/dL (ref 2.9–4.4)
Beta: 1.1 g/dL (ref 0.7–1.3)
GAMMA GLOBULIN: 0.9 g/dL (ref 0.4–1.8)
Globulin, Total: 3 g/dL (ref 2.2–3.9)
TOTAL PROTEIN: 6.4 g/dL (ref 6.0–8.5)

## 2016-06-13 LAB — KAPPA/LAMBDA LIGHT CHAINS
IG KAPPA FREE LIGHT CHAIN: 31.3 mg/L — AB (ref 3.3–19.4)
Ig Lambda Free Light Chain: 30 mg/L — ABNORMAL HIGH (ref 5.7–26.3)
Kappa/Lambda FluidC Ratio: 1.04 (ref 0.26–1.65)

## 2016-06-13 LAB — DIRECT ANTIGLOBULIN TEST (NOT AT ARMC): Coombs', Direct: NEGATIVE

## 2016-07-02 DIAGNOSIS — Z86711 Personal history of pulmonary embolism: Secondary | ICD-10-CM | POA: Diagnosis not present

## 2016-07-02 DIAGNOSIS — Z7901 Long term (current) use of anticoagulants: Secondary | ICD-10-CM | POA: Diagnosis not present

## 2016-07-03 ENCOUNTER — Encounter: Payer: Self-pay | Admitting: Oncology

## 2016-07-03 ENCOUNTER — Telehealth: Payer: Self-pay | Admitting: Hematology

## 2016-07-03 ENCOUNTER — Ambulatory Visit (HOSPITAL_BASED_OUTPATIENT_CLINIC_OR_DEPARTMENT_OTHER): Payer: Medicare Other | Admitting: Oncology

## 2016-07-03 ENCOUNTER — Other Ambulatory Visit (HOSPITAL_BASED_OUTPATIENT_CLINIC_OR_DEPARTMENT_OTHER): Payer: Medicare Other

## 2016-07-03 VITALS — BP 145/50 | HR 52 | Temp 98.2°F | Resp 20 | Wt 175.3 lb

## 2016-07-03 DIAGNOSIS — D649 Anemia, unspecified: Secondary | ICD-10-CM

## 2016-07-03 DIAGNOSIS — N183 Chronic kidney disease, stage 3 (moderate): Secondary | ICD-10-CM | POA: Diagnosis not present

## 2016-07-03 LAB — CBC WITH DIFFERENTIAL/PLATELET
BASO%: 0.8 % (ref 0.0–2.0)
Basophils Absolute: 0.1 10*3/uL (ref 0.0–0.1)
EOS%: 1.5 % (ref 0.0–7.0)
Eosinophils Absolute: 0.1 10*3/uL (ref 0.0–0.5)
HEMATOCRIT: 29 % — AB (ref 38.4–49.9)
HGB: 9.4 g/dL — ABNORMAL LOW (ref 13.0–17.1)
LYMPH#: 1 10*3/uL (ref 0.9–3.3)
LYMPH%: 12 % — ABNORMAL LOW (ref 14.0–49.0)
MCH: 32.1 pg (ref 27.2–33.4)
MCHC: 32.6 g/dL (ref 32.0–36.0)
MCV: 98.4 fL — ABNORMAL HIGH (ref 79.3–98.0)
MONO#: 0.7 10*3/uL (ref 0.1–0.9)
MONO%: 7.9 % (ref 0.0–14.0)
NEUT%: 77.8 % — ABNORMAL HIGH (ref 39.0–75.0)
NEUTROS ABS: 6.8 10*3/uL — AB (ref 1.5–6.5)
PLATELETS: 183 10*3/uL (ref 140–400)
RBC: 2.94 10*6/uL — AB (ref 4.20–5.82)
RDW: 14.3 % (ref 11.0–14.6)
WBC: 8.7 10*3/uL (ref 4.0–10.3)

## 2016-07-03 NOTE — Telephone Encounter (Signed)
Gave patient avs report and appointments for May. Central radiology will call re bx. Per Dr. Thana Farr patient to f/u with Dr. Irene Limbo. Patient aware.

## 2016-07-03 NOTE — Progress Notes (Signed)
[facility]:    Miguel [name], [place] [address]   DIAGNOSIS:   INTERVAL HISTORY: Miguel [name] 83 y.o. male returns for a scheduled follow-up visit. Patient has known history of further chronic anemia.  Patient has been taking iron supplements without improvement in his hemoglobin levels. Today patient did feeling very tired and fatigued.  I have reviewed the recent CBC data. His hemoglobin has improved from 9.1 g to 9.4.  I have recommended doing a bone marrow biopsy upon last visit. However patient has a refused and  wishes to wait further.  Today I have discussed again the same issue of doing a bone marrow biopsy to rule out the possibility of underlying myelodysplastic syndrome.  I did not perform complete physical examination however I have reviewed the laboratory data. Patient has agreed for a bone marrow biopsy however wishes to have the hospital under conscious sedation.   Patient Active Problem List   Diagnosis Date Noted  . BRADYCARDIA 07/19/2008  . DIZZINESS 07/19/2008  . FATIGUE 07/19/2008  . Shortness of breath 07/19/2008  . DEPRESSION 07/15/2008  . PULMONARY EMBOLISM 07/15/2008  . DVT 07/15/2008  . ARTHRITIS, RHEUMATOID 07/15/2008    is allergic to ibuprofen; oxaprozin; and phenelzine sulfate.  MEDICAL HISTORY: Past Medical History:  Diagnosis Date  . Arthritis   . Cancer ([facility])   . Clotting disorder ([facility])   . Depression     SURGICAL HISTORY: Past Surgical History:  Procedure Laterality Date  . PROSTATE SURGERY    . SPINE SURGERY      SOCIAL HISTORY: Social History   Social History  . Marital status: Married    Spouse name: N/A  . Number of children: N/A  . Years of education: N/A   Occupational History  . Not on file.   Social History Main Topics  . Smoking status: Former Research scientist (life sciences)  . Smokeless tobacco: Former Systems developer    Quit date: 03/25/1957  . Alcohol use No   . Drug use: No  . Sexual activity: Not on file   Other Topics Concern  . Not on file   Social History Narrative  . No narrative on file    FAMILY HISTORY: History reviewed. No pertinent family history.  Review of Systems - Oncology    PHYSICAL EXAMINATION  ECOG PERFORMANCE STATUS: 1 - Symptomatic but completely ambulatory  Vitals:   07/03/16 1300 07/03/16 1334  BP: (!) 141/38 (!) 145/50  Pulse: (!) 56 (!) 52  Resp: 20   Temp: 98.2 F (36.8 C)     Physical Exam  LABORATORY DATA:  CBC    Component Value Date/Time   WBC 8.7 07/03/2016 1312   WBC 8.2 01/13/2009 0645   RBC 2.94 (L) 07/03/2016 1312   RBC 3.59 (L) 01/13/2009 0645   HGB 9.4 (L) 07/03/2016 1312   HCT 29.0 (L) 07/03/2016 1312   PLT 183 07/03/2016 1312   MCV 98.4 (H) 07/03/2016 1312   MCH 32.1 07/03/2016 1312   MCHC 32.6 07/03/2016 1312   MCHC 33.9 01/13/2009 0645   RDW 14.3 07/03/2016 1312   LYMPHSABS 1.0 07/03/2016 1312   MONOABS 0.7 07/03/2016 1312   EOSABS 0.1 07/03/2016 1312   BASOSABS 0.1 07/03/2016 1312    CMP     Component Value Date/Time   NA 139 06/05/2016 1537   K 4.8 06/05/2016 1537   CL 108 01/13/2009 0645   CO2 24 06/05/2016 1537   GLUCOSE 97 06/05/2016  1537   BUN 17.9 06/05/2016 1537   CREATININE 1.5 (H) 06/05/2016 1537   CALCIUM 9.4 06/05/2016 1537   PROT 6.4 06/12/2016 1513   PROT 6.9 06/05/2016 1537   ALBUMIN 3.8 06/05/2016 1537   AST 23 06/05/2016 1537   ALT 14 06/05/2016 1537   ALKPHOS 50 06/05/2016 1537   BILITOT 0.29 06/05/2016 1537   GFRNONAA 57 (L) 01/13/2009 0645   GFRAA  01/13/2009 0645    >60        The eGFR has been calculated using the MDRD equation. This calculation has not been validated in all clinical situations. eGFR's persistently <60 mL/min signify possible Chronic Kidney Disease.     ASSESSMENT and THERAPY PLAN:   1 History of seronegative rheumatoid arthritis 2. History of DVT and a pulmonary embolism (unprovoked ) 3. Chronic  depression and anxiety. 5. Macrocytic anemia 6. 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 7. History of mixed hyperlipidemia. 8. Chronic renal insufficiency stage III    The above patient has a chronic anemia. So far workup has been negative for multiple myeloma, hemolytic anemia or chronic blood loss.   At this time I do suspect patient might have refractory anemia possibly secondary to underlying myelodysplastic syndrome.  I have strongly recommended to have a bone marrow biopsy to complete the workup.  Patient has agreed now to have a bone marrow biopsy in the hospital under conscious sedation. Once I have scheduled a bone marrow biopsy within a week. A follow-up appointment also has been scheduled 1 week after his bone marrow biopsy.   The risks and benefits of the bone marrow biopsy procedure have been explained to the patient and his wife.  I have also advised the patient to call our office if he feels further fatigued or tired. Orders Placed This Encounter  Procedures  . CT BONE MARROW BIOPSY & ASPIRATION    Standing Status:   Future    Standing Expiration Date:   10/02/2017    Scheduling Instructions:     Please send the specimen for 1) Routine morphology; 2) flow cytometry 3) cytogenetics 4) FISH for MDS panel.    Order Specific Question:   Reason for Exam (SYMPTOM  OR DIAGNOSIS REQUIRED)    Answer:   anemia    Order Specific Question:   Preferred imaging location?    Answer:   [facility]    Order Specific Question:   Radiology Contrast Protocol - do NOT remove file path    Answer:   [email or web address]    All questions were answered. The patient knows to call the clinic with any problems, questions or concerns. We can certainly see the patient much sooner if necessary. This note was electronically signed. Judge Stall, MD 07/03/2016

## 2016-07-05 ENCOUNTER — Other Ambulatory Visit: Payer: Self-pay | Admitting: Oncology

## 2016-07-12 DIAGNOSIS — Z961 Presence of intraocular lens: Secondary | ICD-10-CM | POA: Diagnosis not present

## 2016-07-12 DIAGNOSIS — H5203 Hypermetropia, bilateral: Secondary | ICD-10-CM | POA: Diagnosis not present

## 2016-07-12 DIAGNOSIS — H04123 Dry eye syndrome of bilateral lacrimal glands: Secondary | ICD-10-CM | POA: Diagnosis not present

## 2016-07-12 DIAGNOSIS — H524 Presbyopia: Secondary | ICD-10-CM | POA: Diagnosis not present

## 2016-07-12 DIAGNOSIS — H52223 Regular astigmatism, bilateral: Secondary | ICD-10-CM | POA: Diagnosis not present

## 2016-07-12 DIAGNOSIS — H43813 Vitreous degeneration, bilateral: Secondary | ICD-10-CM | POA: Diagnosis not present

## 2016-07-20 ENCOUNTER — Other Ambulatory Visit: Payer: Self-pay | Admitting: Radiology

## 2016-07-23 ENCOUNTER — Ambulatory Visit (HOSPITAL_COMMUNITY)
Admission: RE | Admit: 2016-07-23 | Discharge: 2016-07-23 | Disposition: A | Discharge status: Home / Self Care | Payer: Medicare Other | Source: Ambulatory Visit | Attending: Oncology | Admitting: Oncology

## 2016-07-23 ENCOUNTER — Telehealth (HOSPITAL_COMMUNITY): Payer: Self-pay | Admitting: *Deleted

## 2016-07-23 ENCOUNTER — Other Ambulatory Visit: Payer: Self-pay | Admitting: Oncology

## 2016-07-23 ENCOUNTER — Encounter (HOSPITAL_COMMUNITY): Payer: Self-pay

## 2016-07-23 DIAGNOSIS — Z79899 Other long term (current) drug therapy: Secondary | ICD-10-CM | POA: Insufficient documentation

## 2016-07-23 DIAGNOSIS — Z7901 Long term (current) use of anticoagulants: Secondary | ICD-10-CM | POA: Diagnosis not present

## 2016-07-23 DIAGNOSIS — D649 Anemia, unspecified: Secondary | ICD-10-CM

## 2016-07-23 DIAGNOSIS — Z87891 Personal history of nicotine dependence: Secondary | ICD-10-CM | POA: Diagnosis not present

## 2016-07-23 DIAGNOSIS — D759 Disease of blood and blood-forming organs, unspecified: Secondary | ICD-10-CM | POA: Diagnosis not present

## 2016-07-23 DIAGNOSIS — D7589 Other specified diseases of blood and blood-forming organs: Secondary | ICD-10-CM | POA: Diagnosis not present

## 2016-07-23 LAB — CBC WITH DIFFERENTIAL/PLATELET
Basophils Absolute: 0.1 10*3/uL (ref 0.0–0.1)
Basophils Relative: 1 %
EOS ABS: 0.2 10*3/uL (ref 0.0–0.7)
EOS PCT: 3 %
HCT: 29 % — ABNORMAL LOW (ref 39.0–52.0)
Hemoglobin: 9.2 g/dL — ABNORMAL LOW (ref 13.0–17.0)
LYMPHS ABS: 2 10*3/uL (ref 0.7–4.0)
LYMPHS PCT: 24 %
MCH: 31.7 pg (ref 26.0–34.0)
MCHC: 31.7 g/dL (ref 30.0–36.0)
MCV: 100 fL (ref 78.0–100.0)
MONO ABS: 0.6 10*3/uL (ref 0.1–1.0)
Monocytes Relative: 7 %
Neutro Abs: 5.4 10*3/uL (ref 1.7–7.7)
Neutrophils Relative %: 65 %
PLATELETS: 201 10*3/uL (ref 150–400)
RBC: 2.9 MIL/uL — ABNORMAL LOW (ref 4.22–5.81)
RDW: 13.9 % (ref 11.5–15.5)
WBC: 8.3 10*3/uL (ref 4.0–10.5)

## 2016-07-23 LAB — PROTIME-INR
INR: 2.53
PROTHROMBIN TIME: 27.8 s — AB (ref 11.4–15.2)

## 2016-07-23 LAB — BASIC METABOLIC PANEL
ANION GAP: 7 (ref 5–15)
BUN: 26 mg/dL — AB (ref 6–20)
CO2: 24 mmol/L (ref 22–32)
CREATININE: 1.72 mg/dL — AB (ref 0.61–1.24)
Calcium: 9.2 mg/dL (ref 8.9–10.3)
Chloride: 107 mmol/L (ref 101–111)
GFR calc Af Amer: 41 mL/min — ABNORMAL LOW (ref >60)
GFR, EST NON AFRICAN AMERICAN: 35 mL/min — AB (ref >60)
GLUCOSE: 99 mg/dL (ref 65–99)
Potassium: 4.3 mmol/L (ref 3.5–5.1)
Sodium: 138 mmol/L (ref 135–145)

## 2016-07-23 MED ORDER — FENTANYL CITRATE (PF) 100 MCG/2ML IJ SOLN
INTRAMUSCULAR | Status: AC | PRN
  Administered 2016-07-23 (×2): 50 ug via INTRAVENOUS
  Administered 2016-07-23: 25 ug via INTRAVENOUS

## 2016-07-23 MED ORDER — SODIUM CHLORIDE 0.9 % IV SOLN
INTRAVENOUS | Status: DC
  Administered 2016-07-23: 10:00:00 via INTRAVENOUS

## 2016-07-23 MED ORDER — MIDAZOLAM HCL 2 MG/2ML IJ SOLN
INTRAMUSCULAR | Status: AC | PRN
  Administered 2016-07-23 (×3): 1 mg via INTRAVENOUS

## 2016-07-23 MED ORDER — FENTANYL CITRATE (PF) 100 MCG/2ML IJ SOLN
INTRAMUSCULAR | Status: AC
  Filled 2016-07-23: qty 2

## 2016-07-23 MED ORDER — MIDAZOLAM HCL 2 MG/2ML IJ SOLN
INTRAMUSCULAR | Status: AC
  Filled 2016-07-23: qty 6

## 2016-07-23 NOTE — Discharge Instructions (Addendum)
Moderate Conscious Sedation, Adult, Care After These instructions provide you with information about caring for yourself after your procedure. Your health care provider may also give you more specific instructions. Your treatment has been planned according to current medical practices, but problems sometimes occur. Call your health care provider if you have any problems or questions after your procedure. What can I expect after the procedure? After your procedure, it is common:  To feel sleepy for several hours.  To feel clumsy and have poor balance for several hours.  To have poor judgment for several hours.  To vomit if you eat too soon. Follow these instructions at home: For at least 24 hours after the procedure:    Do not:  Participate in activities where you could fall or become injured.  Drive.  Use heavy machinery.  Drink alcohol.  Take sleeping pills or medicines that cause drowsiness.  Make important decisions or sign legal documents.  Take care of children on your own.  Rest. Eating and drinking   Follow the diet recommended by your health care provider.  If you vomit:  Drink water, juice, or soup when you can drink without vomiting.  Make sure you have little or no nausea before eating solid foods. General instructions   Have a responsible adult stay with you until you are awake and alert.  Take over-the-counter and prescription medicines only as told by your health care provider.  If you smoke, do not smoke without supervision.  Keep all follow-up visits as told by your health care provider. This is important. Contact a health care provider if:  You keep feeling nauseous or you keep vomiting.  You feel light-headed.  You develop a rash.  You have a fever. Get help right away if:  You have trouble breathing. This information is not intended to replace advice given to you by your health care provider. Make sure you discuss any questions you  have with your health care provider. Document Released: 12/31/2012 Document Revised: 08/15/2015 Document Reviewed: 07/02/2015 Elsevier Interactive Patient Education  2017 [employer]. Bone Marrow Aspiration and Bone Marrow Biopsy, Adult, Care After This sheet gives you information about how to care for yourself after your procedure. Your health care provider may also give you more specific instructions. If you have problems or questions, contact your health care provider. What can I expect after the procedure? After the procedure, it is common to have:  Mild pain and tenderness.  Swelling.  Bruising. Follow these instructions at home:  Take over-the-counter or prescription medicines only as told by your health care provider.  Do not take baths, swim, or use a hot tub until your health care provider approves. Ask if you can take a shower or have a sponge bath.  Follow instructions from your health care provider about how to take care of the puncture site. Make sure you:  Wash your hands with soap and water before you change your bandage (dressing). If soap and water are not available, use hand sanitizer.  Change your dressing as told by your health care provider.  May remove dressing and shower in 24 hours.  Keep site clean and dry. Report signs of infection.  Check your puncture siteevery day for signs of infection. Check for:  More redness, swelling, or pain.  More fluid or blood.  Warmth.  Pus or a bad smell.  Return to your normal activities as told by your health care provider. Ask your health care provider what activities are safe  for you.  Do not drive for 24 hours if you were given a medicine to help you relax (sedative).  Keep all follow-up visits as told by your health care provider. This is important. Contact a health care provider if:  You have more redness, swelling, or pain around the puncture site.  You have more fluid or blood coming from the puncture  site.  Your puncture site feels warm to the touch.  You have pus or a bad smell coming from the puncture site.  You have a fever.  Your pain is not controlled with medicine. This information is not intended to replace advice given to you by your health care provider. Make sure you discuss any questions you have with your health care provider. Document Released: 09/29/2004 Document Revised: 09/30/2015 Document Reviewed: 08/24/2015 Elsevier Interactive Patient Education  2017 [employer].

## 2016-07-23 NOTE — Consult Note (Signed)
  Chief Complaint: Patient was seen in consultation today for CT-guided bone marrow biopsy  Referring Physician(s): Konda,Brahma Reddy  Supervising Physician: Yamagata, Glenn  Patient Status: [facility] - Out-pt  History of Present Illness: Miguel [name] is a 84 y.o. male with history of chronic anemia and negative workup for myeloma, hemolytic anemia and chronic blood loss who presents today for CT-guided bone marrow biopsy for further evaluation/rule out MDS. Past medical history also significant for prior PE/DVT as well as prostate cancer. He is on Coumadin.  Past Medical History:  Diagnosis Date  . Arthritis   . Cancer ([facility])   . Clotting disorder ([facility])   . Depression     Past Surgical History:  Procedure Laterality Date  . PROSTATE SURGERY    . SPINE SURGERY      Allergies: Ibuprofen; Oxaprozin; and Phenelzine sulfate  Medications: Prior to Admission medications   Medication Sig Start Date End Date Taking? Authorizing Provider  ALPRAZolam (XANAX) 0.25 MG tablet Take 0.25 mg by mouth at bedtime as needed for sleep.    Historical Provider, MD  ARIPiprazole (ABILIFY) 2 MG tablet Take 1 mg by mouth daily.    Historical Provider, MD  Cholecalciferol (VITAMIN D3) 2000 UNITS TABS Take by mouth daily.    Historical Provider, MD  Ferrous Sulfate (IRON) 90 (18 FE) MG TABS Take by mouth daily. Reported on 03/29/2015    Historical Provider, MD  JANTOVEN 5 MG tablet Take 5 mg by mouth as directed. Reported on 03/29/2015 11/08/12   Historical Provider, MD  lithium carbonate 150 MG capsule Take 150 mg by mouth 2 (two) times daily. 09/22/12   Historical Provider, MD  Multiple Vitamin (MULTIVITAMIN) tablet Take 1 tablet by mouth daily.    Historical Provider, MD  predniSONE (DELTASONE) 2.5 MG tablet TAKE 1 TABLET BY MOUTH WITH FOOD OR MILK EVERY DAY 02/29/16   Historical Provider, MD  sertraline (ZOLOFT) 100 MG tablet Take 100 mg by mouth daily. 10/13/12   Historical Provider, MD  traMADol  (ULTRAM) 50 MG tablet Take 50 mg by mouth. 09/28/15   Historical Provider, MD  warfarin (COUMADIN) 5 MG tablet Take by mouth.    Historical Provider, MD     No family history on file.  Social History   Social History  . Marital status: Married    Spouse name: N/A  . Number of children: N/A  . Years of education: N/A   Social History Main Topics  . Smoking status: Former Smoker  . Smokeless tobacco: Former User    Quit date: 03/25/1957  . Alcohol use No  . Drug use: No  . Sexual activity: Not on file   Other Topics Concern  . Not on file   Social History Narrative  . No narrative on file      Review of Systems denies fever, chest pain, dyspnea, cough, abdominal pain, nausea, vomiting or bleeding. He does have occasional headaches, fatigue, back pain.  Vital Signs: Vitals:   07/23/16 0925  BP: (!) 156/64  Pulse: (!) 51  Resp: 16  Temp: 97.7 F (36.5 C)      Physical Exam awake, alert. Chest clear to auscultation bilaterally. Heart with bradycardic but regular rhythm. Abdomen soft, positive bowel sounds, nontender; lower extremities with venous stasis changes and mild edema; ecchymoses occipital /left scapular region from recent fall  Mallampati Score:     Imaging: No results found.  Labs:  CBC:  Recent Labs  06/05/16 1536 07/03/16 1312  WBC 7.6   8.7  HGB 9.1* 9.4*  HCT 28.6* 29.0*  PLT 181 183    COAGS: No results for input(s): INR, APTT in the last 8760 hours.  BMP:  Recent Labs  06/05/16 1537  NA 139  K 4.8  CO2 24  GLUCOSE 97  BUN 17.9  CALCIUM 9.4  CREATININE 1.5*    LIVER FUNCTION TESTS:  Recent Labs  06/05/16 1537 06/12/16 1513  BILITOT 0.29  --   AST 23  --   ALT 14  --   ALKPHOS 50  --   PROT 6.9 6.4  ALBUMIN 3.8  --     TUMOR MARKERS: No results for input(s): AFPTM, CEA, CA199, CHROMGRNA in the last 8760 hours.  Assessment and Plan: 84 y.o. male with history of chronic anemia and negative workup for myeloma,  hemolytic anemia and chronic blood loss who presents today for CT-guided bone marrow biopsy for further evaluation/rule out MDS.Risks and benefits discussed with the patient including, but not limited to bleeding, infection, damage to adjacent structures or low yield requiring additional tests.All of the patient's questions were answered, patient is agreeable to proceed.Consent signed and in chart.     Thank you for this interesting consult.  I greatly enjoyed meeting Miguel [name] and look forward to participating in their care.  A copy of this report was sent to the requesting provider on this date.  Electronically Signed: D. Kevin  07/23/2016, 9:13 AM   I spent a total of  20 minutes   in face to face in clinical consultation, greater than 50% of which was counseling/coordinating care for CT guided bone marrow biopsy        

## 2016-07-23 NOTE — Sedation Documentation (Signed)
Patient is resting comfortably. 

## 2016-07-24 ENCOUNTER — Ambulatory Visit: Payer: Medicare Other | Admitting: Hematology

## 2016-07-26 ENCOUNTER — Telehealth: Payer: Self-pay | Admitting: *Deleted

## 2016-07-26 NOTE — Telephone Encounter (Signed)
"  I received a call for appointment change to August 29, 2016.  I do not want to wait that long to receive my biopsy results.  I just need to see someone to receive results.  I will take anything, any day of week at anytime.  Return number [phone]."  Voicemail left for collaborative.

## 2016-07-26 NOTE — Telephone Encounter (Signed)
Discussed this patient with MD Irene Limbo. Due to increased volume of patients, he should be scheduled with another provider if this day is not okay for him.

## 2016-07-27 ENCOUNTER — Telehealth: Payer: Self-pay

## 2016-07-27 NOTE — Telephone Encounter (Signed)
Pt was initially seen by Dr Thana Farr. He then had a BMBX on  4/30 but his next appt is not until June 6 with Dr Irene Limbo. He is wanting to receive his results from the [facility] soon. The pathology report is available. The chromosome analysis is still in process. Please call pt with results.

## 2016-07-30 ENCOUNTER — Ambulatory Visit: Payer: Medicare Other | Admitting: Hematology

## 2016-07-31 DIAGNOSIS — Z7901 Long term (current) use of anticoagulants: Secondary | ICD-10-CM | POA: Diagnosis not present

## 2016-07-31 DIAGNOSIS — Z86711 Personal history of pulmonary embolism: Secondary | ICD-10-CM | POA: Diagnosis not present

## 2016-08-01 LAB — CHROMOSOME ANALYSIS, BONE MARROW

## 2016-08-03 DIAGNOSIS — M5136 Other intervertebral disc degeneration, lumbar region: Secondary | ICD-10-CM | POA: Diagnosis not present

## 2016-08-03 DIAGNOSIS — M8580 Other specified disorders of bone density and structure, unspecified site: Secondary | ICD-10-CM | POA: Diagnosis not present

## 2016-08-03 DIAGNOSIS — R296 Repeated falls: Secondary | ICD-10-CM | POA: Diagnosis not present

## 2016-08-03 DIAGNOSIS — M1990 Unspecified osteoarthritis, unspecified site: Secondary | ICD-10-CM | POA: Diagnosis not present

## 2016-08-03 DIAGNOSIS — M069 Rheumatoid arthritis, unspecified: Secondary | ICD-10-CM | POA: Diagnosis not present

## 2016-08-03 DIAGNOSIS — Z9181 History of falling: Secondary | ICD-10-CM | POA: Diagnosis not present

## 2016-08-06 DIAGNOSIS — R296 Repeated falls: Secondary | ICD-10-CM | POA: Diagnosis not present

## 2016-08-06 DIAGNOSIS — M069 Rheumatoid arthritis, unspecified: Secondary | ICD-10-CM | POA: Diagnosis not present

## 2016-08-06 DIAGNOSIS — M1990 Unspecified osteoarthritis, unspecified site: Secondary | ICD-10-CM | POA: Diagnosis not present

## 2016-08-06 DIAGNOSIS — M5136 Other intervertebral disc degeneration, lumbar region: Secondary | ICD-10-CM | POA: Diagnosis not present

## 2016-08-06 DIAGNOSIS — Z9181 History of falling: Secondary | ICD-10-CM | POA: Diagnosis not present

## 2016-08-06 DIAGNOSIS — M8580 Other specified disorders of bone density and structure, unspecified site: Secondary | ICD-10-CM | POA: Diagnosis not present

## 2016-08-08 DIAGNOSIS — M5136 Other intervertebral disc degeneration, lumbar region: Secondary | ICD-10-CM | POA: Diagnosis not present

## 2016-08-08 DIAGNOSIS — M8580 Other specified disorders of bone density and structure, unspecified site: Secondary | ICD-10-CM | POA: Diagnosis not present

## 2016-08-08 DIAGNOSIS — R296 Repeated falls: Secondary | ICD-10-CM | POA: Diagnosis not present

## 2016-08-08 DIAGNOSIS — M069 Rheumatoid arthritis, unspecified: Secondary | ICD-10-CM | POA: Diagnosis not present

## 2016-08-08 DIAGNOSIS — M1990 Unspecified osteoarthritis, unspecified site: Secondary | ICD-10-CM | POA: Diagnosis not present

## 2016-08-08 DIAGNOSIS — Z9181 History of falling: Secondary | ICD-10-CM | POA: Diagnosis not present

## 2016-08-09 DIAGNOSIS — M069 Rheumatoid arthritis, unspecified: Secondary | ICD-10-CM | POA: Diagnosis not present

## 2016-08-09 DIAGNOSIS — M1990 Unspecified osteoarthritis, unspecified site: Secondary | ICD-10-CM | POA: Diagnosis not present

## 2016-08-09 DIAGNOSIS — M5136 Other intervertebral disc degeneration, lumbar region: Secondary | ICD-10-CM | POA: Diagnosis not present

## 2016-08-09 DIAGNOSIS — M8580 Other specified disorders of bone density and structure, unspecified site: Secondary | ICD-10-CM | POA: Diagnosis not present

## 2016-08-09 DIAGNOSIS — R296 Repeated falls: Secondary | ICD-10-CM | POA: Diagnosis not present

## 2016-08-09 DIAGNOSIS — Z9181 History of falling: Secondary | ICD-10-CM | POA: Diagnosis not present

## 2016-08-13 DIAGNOSIS — R296 Repeated falls: Secondary | ICD-10-CM | POA: Diagnosis not present

## 2016-08-13 DIAGNOSIS — M5136 Other intervertebral disc degeneration, lumbar region: Secondary | ICD-10-CM | POA: Diagnosis not present

## 2016-08-13 DIAGNOSIS — M1990 Unspecified osteoarthritis, unspecified site: Secondary | ICD-10-CM | POA: Diagnosis not present

## 2016-08-13 DIAGNOSIS — M069 Rheumatoid arthritis, unspecified: Secondary | ICD-10-CM | POA: Diagnosis not present

## 2016-08-13 DIAGNOSIS — Z9181 History of falling: Secondary | ICD-10-CM | POA: Diagnosis not present

## 2016-08-13 DIAGNOSIS — M8580 Other specified disorders of bone density and structure, unspecified site: Secondary | ICD-10-CM | POA: Diagnosis not present

## 2016-08-16 DIAGNOSIS — M5136 Other intervertebral disc degeneration, lumbar region: Secondary | ICD-10-CM | POA: Diagnosis not present

## 2016-08-16 DIAGNOSIS — M8580 Other specified disorders of bone density and structure, unspecified site: Secondary | ICD-10-CM | POA: Diagnosis not present

## 2016-08-16 DIAGNOSIS — M069 Rheumatoid arthritis, unspecified: Secondary | ICD-10-CM | POA: Diagnosis not present

## 2016-08-16 DIAGNOSIS — R296 Repeated falls: Secondary | ICD-10-CM | POA: Diagnosis not present

## 2016-08-16 DIAGNOSIS — M1990 Unspecified osteoarthritis, unspecified site: Secondary | ICD-10-CM | POA: Diagnosis not present

## 2016-08-16 DIAGNOSIS — Z9181 History of falling: Secondary | ICD-10-CM | POA: Diagnosis not present

## 2016-08-17 DIAGNOSIS — M069 Rheumatoid arthritis, unspecified: Secondary | ICD-10-CM | POA: Diagnosis not present

## 2016-08-17 DIAGNOSIS — R296 Repeated falls: Secondary | ICD-10-CM | POA: Diagnosis not present

## 2016-08-17 DIAGNOSIS — Z9181 History of falling: Secondary | ICD-10-CM | POA: Diagnosis not present

## 2016-08-17 DIAGNOSIS — M1990 Unspecified osteoarthritis, unspecified site: Secondary | ICD-10-CM | POA: Diagnosis not present

## 2016-08-17 DIAGNOSIS — M8580 Other specified disorders of bone density and structure, unspecified site: Secondary | ICD-10-CM | POA: Diagnosis not present

## 2016-08-17 DIAGNOSIS — M5136 Other intervertebral disc degeneration, lumbar region: Secondary | ICD-10-CM | POA: Diagnosis not present

## 2016-08-21 DIAGNOSIS — M5136 Other intervertebral disc degeneration, lumbar region: Secondary | ICD-10-CM | POA: Diagnosis not present

## 2016-08-21 DIAGNOSIS — R296 Repeated falls: Secondary | ICD-10-CM | POA: Diagnosis not present

## 2016-08-21 DIAGNOSIS — M8580 Other specified disorders of bone density and structure, unspecified site: Secondary | ICD-10-CM | POA: Diagnosis not present

## 2016-08-21 DIAGNOSIS — M1990 Unspecified osteoarthritis, unspecified site: Secondary | ICD-10-CM | POA: Diagnosis not present

## 2016-08-21 DIAGNOSIS — M069 Rheumatoid arthritis, unspecified: Secondary | ICD-10-CM | POA: Diagnosis not present

## 2016-08-21 DIAGNOSIS — Z9181 History of falling: Secondary | ICD-10-CM | POA: Diagnosis not present

## 2016-08-22 DIAGNOSIS — F323 Major depressive disorder, single episode, severe with psychotic features: Secondary | ICD-10-CM | POA: Diagnosis not present

## 2016-08-23 DIAGNOSIS — M069 Rheumatoid arthritis, unspecified: Secondary | ICD-10-CM | POA: Diagnosis not present

## 2016-08-23 DIAGNOSIS — M5136 Other intervertebral disc degeneration, lumbar region: Secondary | ICD-10-CM | POA: Diagnosis not present

## 2016-08-23 DIAGNOSIS — M8580 Other specified disorders of bone density and structure, unspecified site: Secondary | ICD-10-CM | POA: Diagnosis not present

## 2016-08-23 DIAGNOSIS — R296 Repeated falls: Secondary | ICD-10-CM | POA: Diagnosis not present

## 2016-08-23 DIAGNOSIS — M1990 Unspecified osteoarthritis, unspecified site: Secondary | ICD-10-CM | POA: Diagnosis not present

## 2016-08-23 DIAGNOSIS — Z9181 History of falling: Secondary | ICD-10-CM | POA: Diagnosis not present

## 2016-08-24 DIAGNOSIS — M069 Rheumatoid arthritis, unspecified: Secondary | ICD-10-CM | POA: Diagnosis not present

## 2016-08-24 DIAGNOSIS — M1990 Unspecified osteoarthritis, unspecified site: Secondary | ICD-10-CM | POA: Diagnosis not present

## 2016-08-24 DIAGNOSIS — M8580 Other specified disorders of bone density and structure, unspecified site: Secondary | ICD-10-CM | POA: Diagnosis not present

## 2016-08-24 DIAGNOSIS — Z9181 History of falling: Secondary | ICD-10-CM | POA: Diagnosis not present

## 2016-08-24 DIAGNOSIS — R296 Repeated falls: Secondary | ICD-10-CM | POA: Diagnosis not present

## 2016-08-24 DIAGNOSIS — M5136 Other intervertebral disc degeneration, lumbar region: Secondary | ICD-10-CM | POA: Diagnosis not present

## 2016-08-27 DIAGNOSIS — M8580 Other specified disorders of bone density and structure, unspecified site: Secondary | ICD-10-CM | POA: Diagnosis not present

## 2016-08-27 DIAGNOSIS — R296 Repeated falls: Secondary | ICD-10-CM | POA: Diagnosis not present

## 2016-08-27 DIAGNOSIS — M069 Rheumatoid arthritis, unspecified: Secondary | ICD-10-CM | POA: Diagnosis not present

## 2016-08-27 DIAGNOSIS — Z9181 History of falling: Secondary | ICD-10-CM | POA: Diagnosis not present

## 2016-08-27 DIAGNOSIS — M5136 Other intervertebral disc degeneration, lumbar region: Secondary | ICD-10-CM | POA: Diagnosis not present

## 2016-08-27 DIAGNOSIS — M1990 Unspecified osteoarthritis, unspecified site: Secondary | ICD-10-CM | POA: Diagnosis not present

## 2016-08-28 DIAGNOSIS — Z9181 History of falling: Secondary | ICD-10-CM | POA: Diagnosis not present

## 2016-08-28 DIAGNOSIS — R296 Repeated falls: Secondary | ICD-10-CM | POA: Diagnosis not present

## 2016-08-28 DIAGNOSIS — M1990 Unspecified osteoarthritis, unspecified site: Secondary | ICD-10-CM | POA: Diagnosis not present

## 2016-08-28 DIAGNOSIS — M5136 Other intervertebral disc degeneration, lumbar region: Secondary | ICD-10-CM | POA: Diagnosis not present

## 2016-08-28 DIAGNOSIS — Z86718 Personal history of other venous thrombosis and embolism: Secondary | ICD-10-CM | POA: Diagnosis not present

## 2016-08-28 DIAGNOSIS — M8580 Other specified disorders of bone density and structure, unspecified site: Secondary | ICD-10-CM | POA: Diagnosis not present

## 2016-08-28 DIAGNOSIS — M069 Rheumatoid arthritis, unspecified: Secondary | ICD-10-CM | POA: Diagnosis not present

## 2016-08-28 DIAGNOSIS — Z7901 Long term (current) use of anticoagulants: Secondary | ICD-10-CM | POA: Diagnosis not present

## 2016-08-29 ENCOUNTER — Other Ambulatory Visit: Payer: Self-pay | Admitting: *Deleted

## 2016-08-29 ENCOUNTER — Ambulatory Visit (HOSPITAL_BASED_OUTPATIENT_CLINIC_OR_DEPARTMENT_OTHER): Payer: Medicare Other

## 2016-08-29 ENCOUNTER — Telehealth: Payer: Self-pay | Admitting: Hematology

## 2016-08-29 ENCOUNTER — Encounter: Payer: Self-pay | Admitting: Hematology

## 2016-08-29 ENCOUNTER — Ambulatory Visit (HOSPITAL_BASED_OUTPATIENT_CLINIC_OR_DEPARTMENT_OTHER): Payer: Medicare Other | Admitting: Hematology

## 2016-08-29 VITALS — BP 147/66 | HR 61 | Temp 98.4°F | Resp 18 | Ht 67.0 in | Wt 186.4 lb

## 2016-08-29 DIAGNOSIS — N183 Chronic kidney disease, stage 3 unspecified: Secondary | ICD-10-CM

## 2016-08-29 DIAGNOSIS — D649 Anemia, unspecified: Secondary | ICD-10-CM

## 2016-08-29 DIAGNOSIS — R296 Repeated falls: Secondary | ICD-10-CM | POA: Diagnosis not present

## 2016-08-29 DIAGNOSIS — M1990 Unspecified osteoarthritis, unspecified site: Secondary | ICD-10-CM | POA: Diagnosis not present

## 2016-08-29 DIAGNOSIS — M5136 Other intervertebral disc degeneration, lumbar region: Secondary | ICD-10-CM | POA: Diagnosis not present

## 2016-08-29 DIAGNOSIS — M069 Rheumatoid arthritis, unspecified: Secondary | ICD-10-CM

## 2016-08-29 DIAGNOSIS — Z9181 History of falling: Secondary | ICD-10-CM | POA: Diagnosis not present

## 2016-08-29 DIAGNOSIS — D519 Vitamin B12 deficiency anemia, unspecified: Secondary | ICD-10-CM

## 2016-08-29 DIAGNOSIS — D500 Iron deficiency anemia secondary to blood loss (chronic): Secondary | ICD-10-CM | POA: Insufficient documentation

## 2016-08-29 LAB — COMPREHENSIVE METABOLIC PANEL
ALT: 12 U/L (ref 0–55)
AST: 20 U/L (ref 5–34)
Albumin: 3.4 g/dL — ABNORMAL LOW (ref 3.5–5.0)
Alkaline Phosphatase: 57 U/L (ref 40–150)
Anion Gap: 7 mEq/L (ref 3–11)
BUN: 26.2 mg/dL — ABNORMAL HIGH (ref 7.0–26.0)
CO2: 24 meq/L (ref 22–29)
Calcium: 9.6 mg/dL (ref 8.4–10.4)
Chloride: 108 mEq/L (ref 98–109)
Creatinine: 1.7 mg/dL — ABNORMAL HIGH (ref 0.7–1.3)
EGFR: 36 mL/min/{1.73_m2} — AB (ref >90)
GLUCOSE: 96 mg/dL (ref 70–140)
POTASSIUM: 4.6 meq/L (ref 3.5–5.1)
SODIUM: 140 meq/L (ref 136–145)
TOTAL PROTEIN: 6.8 g/dL (ref 6.4–8.3)
Total Bilirubin: 0.26 mg/dL (ref 0.20–1.20)

## 2016-08-29 LAB — CBC & DIFF AND RETIC
BASO%: 0.4 % (ref 0.0–2.0)
BASOS ABS: 0 10*3/uL (ref 0.0–0.1)
EOS%: 2.4 % (ref 0.0–7.0)
Eosinophils Absolute: 0.2 10*3/uL (ref 0.0–0.5)
HEMATOCRIT: 29.6 % — AB (ref 38.4–49.9)
HGB: 9.1 g/dL — ABNORMAL LOW (ref 13.0–17.1)
IMMATURE RETIC FRACT: 11.1 % — AB (ref 3.00–10.60)
LYMPH#: 1.3 10*3/uL (ref 0.9–3.3)
LYMPH%: 15 % (ref 14.0–49.0)
MCH: 29.2 pg (ref 27.2–33.4)
MCHC: 30.7 g/dL — ABNORMAL LOW (ref 32.0–36.0)
MCV: 94.9 fL (ref 79.3–98.0)
MONO#: 0.8 10*3/uL (ref 0.1–0.9)
MONO%: 9.9 % (ref 0.0–14.0)
NEUT#: 6.1 10*3/uL (ref 1.5–6.5)
NEUT%: 72.3 % (ref 39.0–75.0)
PLATELETS: 221 10*3/uL (ref 140–400)
RBC: 3.12 10*6/uL — AB (ref 4.20–5.82)
RDW: 13.6 % (ref 11.0–14.6)
RETIC CT ABS: 33.7 10*3/uL — AB (ref 34.80–93.90)
Retic %: 1.08 % (ref 0.80–1.80)
WBC: 8.4 10*3/uL (ref 4.0–10.3)

## 2016-08-29 LAB — IRON AND TIBC
%SAT: 7 % — ABNORMAL LOW (ref 20–55)
Iron: 25 ug/dL — ABNORMAL LOW (ref 42–163)
TIBC: 353 ug/dL (ref 202–409)
UIBC: 328 ug/dL (ref 117–376)

## 2016-08-29 LAB — FERRITIN: Ferritin: 16 ng/ml — ABNORMAL LOW (ref 22–316)

## 2016-08-29 NOTE — Telephone Encounter (Signed)
Gave patient avs report and appointments for June and August.

## 2016-08-29 NOTE — Patient Instructions (Signed)
Thank you for choosing [facility] to provide your oncology and hematology care.  To afford each patient quality time with our providers, please arrive 30 minutes before your scheduled appointment time.  If you arrive late for your appointment, you may be asked to reschedule.  We strive to give you quality time with our providers, and arriving late affects you and other patients whose appointments are after yours.   If you are a no show for multiple scheduled visits, you may be dismissed from the clinic at the providers discretion.    Again, thank you for choosing [facility], our hope is that these requests will decrease the amount of time that you wait before being seen by our physicians.  ______________________________________________________________________  Should you have questions after your visit to the [facility], please contact our office at [phone] between the hours of 8:30 and 4:30 p.m.    Voicemails left after 4:30p.m will not be returned until the following business day.    For prescription refill requests, please have your pharmacy contact us directly.  Please also try to allow 48 hours for prescription requests.    Please contact the scheduling department for questions regarding scheduling.  For scheduling of procedures such as PET scans, CT scans, MRI, Ultrasound, etc please contact central scheduling at [phone].    Resources For Cancer Patients and Caregivers:   Oncolink.org:  A wonderful resource for patients and healthcare providers for information regarding your disease, ways to tract your treatment, what to expect, etc.     American Cancer Society:  800-227-2345  Can help patients locate various types of support and financial assistance  Cancer Care: 1-800-813-HOPE (4673) Provides financial assistance, online support groups, medication/co-pay assistance.    [place] DSS:  336-641-3447 Where to apply for food  stamps, Medicaid, and utility assistance  Medicare Rights Center: 800-333-4114 Helps people with Medicare understand their rights and benefits, navigate the Medicare system, and secure the quality healthcare they deserve  SCAT: 336-333-6589 [place] [employer]'s shared-ride transportation service for eligible riders who have a disability that prevents them from riding the fixed route bus.    For additional information on assistance programs please contact our social worker:   Grier Hock/Abigail Elmore:  [phone]            

## 2016-08-30 DIAGNOSIS — M5136 Other intervertebral disc degeneration, lumbar region: Secondary | ICD-10-CM | POA: Diagnosis not present

## 2016-08-30 DIAGNOSIS — M8580 Other specified disorders of bone density and structure, unspecified site: Secondary | ICD-10-CM | POA: Diagnosis not present

## 2016-08-30 DIAGNOSIS — Z9181 History of falling: Secondary | ICD-10-CM | POA: Diagnosis not present

## 2016-08-30 DIAGNOSIS — R296 Repeated falls: Secondary | ICD-10-CM | POA: Diagnosis not present

## 2016-08-30 DIAGNOSIS — M1990 Unspecified osteoarthritis, unspecified site: Secondary | ICD-10-CM | POA: Diagnosis not present

## 2016-08-30 DIAGNOSIS — M069 Rheumatoid arthritis, unspecified: Secondary | ICD-10-CM | POA: Diagnosis not present

## 2016-09-03 DIAGNOSIS — M1990 Unspecified osteoarthritis, unspecified site: Secondary | ICD-10-CM | POA: Diagnosis not present

## 2016-09-03 DIAGNOSIS — M5136 Other intervertebral disc degeneration, lumbar region: Secondary | ICD-10-CM | POA: Diagnosis not present

## 2016-09-03 DIAGNOSIS — Z9181 History of falling: Secondary | ICD-10-CM | POA: Diagnosis not present

## 2016-09-03 DIAGNOSIS — R296 Repeated falls: Secondary | ICD-10-CM | POA: Diagnosis not present

## 2016-09-03 DIAGNOSIS — M069 Rheumatoid arthritis, unspecified: Secondary | ICD-10-CM | POA: Diagnosis not present

## 2016-09-03 DIAGNOSIS — M8580 Other specified disorders of bone density and structure, unspecified site: Secondary | ICD-10-CM | POA: Diagnosis not present

## 2016-09-05 DIAGNOSIS — M1990 Unspecified osteoarthritis, unspecified site: Secondary | ICD-10-CM | POA: Diagnosis not present

## 2016-09-05 DIAGNOSIS — R296 Repeated falls: Secondary | ICD-10-CM | POA: Diagnosis not present

## 2016-09-05 DIAGNOSIS — M069 Rheumatoid arthritis, unspecified: Secondary | ICD-10-CM | POA: Diagnosis not present

## 2016-09-05 DIAGNOSIS — Z9181 History of falling: Secondary | ICD-10-CM | POA: Diagnosis not present

## 2016-09-05 DIAGNOSIS — M8580 Other specified disorders of bone density and structure, unspecified site: Secondary | ICD-10-CM | POA: Diagnosis not present

## 2016-09-05 DIAGNOSIS — M5136 Other intervertebral disc degeneration, lumbar region: Secondary | ICD-10-CM | POA: Diagnosis not present

## 2016-09-06 ENCOUNTER — Ambulatory Visit (HOSPITAL_BASED_OUTPATIENT_CLINIC_OR_DEPARTMENT_OTHER): Payer: Medicare Other

## 2016-09-06 VITALS — BP 123/61 | HR 50 | Temp 98.6°F | Resp 18

## 2016-09-06 DIAGNOSIS — D500 Iron deficiency anemia secondary to blood loss (chronic): Secondary | ICD-10-CM | POA: Diagnosis present

## 2016-09-06 DIAGNOSIS — N183 Chronic kidney disease, stage 3 (moderate): Secondary | ICD-10-CM

## 2016-09-06 MED ORDER — SODIUM CHLORIDE 0.9 % IV SOLN
750.0000 mg | Freq: Once | INTRAVENOUS | Status: AC
  Administered 2016-09-06: 750 mg via INTRAVENOUS
  Filled 2016-09-06: qty 15

## 2016-09-06 NOTE — Patient Instructions (Signed)
Ferric carboxymaltose injection What is this medicine? FERRIC CARBOXYMALTOSE (ferr-ik car-box-ee-mol-toes) is an iron complex. Iron is used to make healthy red blood cells, which carry oxygen and nutrients throughout the body. This medicine is used to treat anemia in people with chronic kidney disease or people who cannot take iron by mouth. This medicine may be used for other purposes; ask your health care provider or pharmacist if you have questions. COMMON BRAND NAME(S): Injectafer What should I tell my health care provider before I take this medicine? They need to know if you have any of these conditions: -anemia not caused by low iron levels -high levels of iron in the blood -liver disease -an unusual or allergic reaction to iron, other medicines, foods, dyes, or preservatives -pregnant or trying to get pregnant -breast-feeding How should I use this medicine? This medicine is for infusion into a vein. It is given by a health care professional in a hospital or clinic setting. Talk to your pediatrician regarding the use of this medicine in children. Special care may be needed. Overdosage: If you think you have taken too much of this medicine contact a poison control center or emergency room at once. NOTE: This medicine is only for you. Do not share this medicine with others. What if I miss a dose? It is important not to miss your dose. Call your doctor or health care professional if you are unable to keep an appointment. What may interact with this medicine? Do not take this medicine with any of the following medications: -deferoxamine -dimercaprol -other iron products This medicine may also interact with the following medications: -chloramphenicol -deferasirox This list may not describe all possible interactions. Give your health care provider a list of all the medicines, herbs, non-prescription drugs, or dietary supplements you use. Also tell them if you smoke, drink alcohol, or use  illegal drugs. Some items may interact with your medicine. What should I watch for while using this medicine? Visit your doctor or health care professional regularly. Tell your doctor if your symptoms do not start to get better or if they get worse. You may need blood work done while you are taking this medicine. You may need to follow a special diet. Talk to your doctor. Foods that contain iron include: whole grains/cereals, dried fruits, beans, or peas, leafy green vegetables, and organ meats (liver, kidney). What side effects may I notice from receiving this medicine? Side effects that you should report to your doctor or health care professional as soon as possible: -allergic reactions like skin rash, itching or hives, swelling of the face, lips, or tongue -breathing problems -changes in blood pressure -feeling faint or lightheaded, falls -flushing, sweating, or hot feelings Side effects that usually do not require medical attention (report to your doctor or health care professional if they continue or are bothersome): -changes in taste -constipation -dizziness -headache -nausea -pain, redness, or irritation at site where injected -vomiting This list may not describe all possible side effects. Call your doctor for medical advice about side effects. You may report side effects to FDA at 1-800-FDA-1088. Where should I keep my medicine? This drug is given in a hospital or clinic and will not be stored at home. NOTE: This sheet is a summary. It may not cover all possible information. If you have questions about this medicine, talk to your doctor, pharmacist, or health care provider.  2018 Elsevier/Gold Standard (2015-04-14 11:20:47)  

## 2016-09-10 DIAGNOSIS — M1990 Unspecified osteoarthritis, unspecified site: Secondary | ICD-10-CM | POA: Diagnosis not present

## 2016-09-10 DIAGNOSIS — M069 Rheumatoid arthritis, unspecified: Secondary | ICD-10-CM | POA: Diagnosis not present

## 2016-09-10 DIAGNOSIS — M5136 Other intervertebral disc degeneration, lumbar region: Secondary | ICD-10-CM | POA: Diagnosis not present

## 2016-09-10 DIAGNOSIS — Z9181 History of falling: Secondary | ICD-10-CM | POA: Diagnosis not present

## 2016-09-10 DIAGNOSIS — M8580 Other specified disorders of bone density and structure, unspecified site: Secondary | ICD-10-CM | POA: Diagnosis not present

## 2016-09-10 DIAGNOSIS — R296 Repeated falls: Secondary | ICD-10-CM | POA: Diagnosis not present

## 2016-09-11 DIAGNOSIS — Z86718 Personal history of other venous thrombosis and embolism: Secondary | ICD-10-CM | POA: Diagnosis not present

## 2016-09-11 DIAGNOSIS — Z7901 Long term (current) use of anticoagulants: Secondary | ICD-10-CM | POA: Diagnosis not present

## 2016-09-12 DIAGNOSIS — M069 Rheumatoid arthritis, unspecified: Secondary | ICD-10-CM | POA: Diagnosis not present

## 2016-09-12 DIAGNOSIS — M5136 Other intervertebral disc degeneration, lumbar region: Secondary | ICD-10-CM | POA: Diagnosis not present

## 2016-09-12 DIAGNOSIS — M8580 Other specified disorders of bone density and structure, unspecified site: Secondary | ICD-10-CM | POA: Diagnosis not present

## 2016-09-12 DIAGNOSIS — M1990 Unspecified osteoarthritis, unspecified site: Secondary | ICD-10-CM | POA: Diagnosis not present

## 2016-09-12 DIAGNOSIS — R296 Repeated falls: Secondary | ICD-10-CM | POA: Diagnosis not present

## 2016-09-12 DIAGNOSIS — Z9181 History of falling: Secondary | ICD-10-CM | POA: Diagnosis not present

## 2016-09-14 ENCOUNTER — Ambulatory Visit (HOSPITAL_BASED_OUTPATIENT_CLINIC_OR_DEPARTMENT_OTHER): Payer: Medicare Other

## 2016-09-14 VITALS — BP 143/63 | HR 54 | Temp 98.2°F | Resp 18

## 2016-09-14 DIAGNOSIS — D649 Anemia, unspecified: Secondary | ICD-10-CM | POA: Diagnosis present

## 2016-09-14 DIAGNOSIS — N183 Chronic kidney disease, stage 3 (moderate): Secondary | ICD-10-CM

## 2016-09-14 DIAGNOSIS — D500 Iron deficiency anemia secondary to blood loss (chronic): Secondary | ICD-10-CM

## 2016-09-14 MED ORDER — SODIUM CHLORIDE 0.9 % IV SOLN
750.0000 mg | Freq: Once | INTRAVENOUS | Status: AC
  Administered 2016-09-14: 750 mg via INTRAVENOUS
  Filled 2016-09-14: qty 15

## 2016-09-14 MED ORDER — SODIUM CHLORIDE 0.9 % IV SOLN
Freq: Once | INTRAVENOUS | Status: AC
  Administered 2016-09-14: 13:00:00 via INTRAVENOUS

## 2016-09-14 NOTE — Progress Notes (Signed)
Pt tolerated infusion well. Pt monitored 30 minutes post infusion. Pt and VS stable at discharge.  

## 2016-09-14 NOTE — Patient Instructions (Signed)
Ferric carboxymaltose injection What is this medicine? FERRIC CARBOXYMALTOSE (ferr-ik car-box-ee-mol-toes) is an iron complex. Iron is used to make healthy red blood cells, which carry oxygen and nutrients throughout the body. This medicine is used to treat anemia in people with chronic kidney disease or people who cannot take iron by mouth. This medicine may be used for other purposes; ask your health care provider or pharmacist if you have questions. COMMON BRAND NAME(S): Injectafer What should I tell my health care provider before I take this medicine? They need to know if you have any of these conditions: -anemia not caused by low iron levels -high levels of iron in the blood -liver disease -an unusual or allergic reaction to iron, other medicines, foods, dyes, or preservatives -pregnant or trying to get pregnant -breast-feeding How should I use this medicine? This medicine is for infusion into a vein. It is given by a health care professional in a hospital or clinic setting. Talk to your pediatrician regarding the use of this medicine in children. Special care may be needed. Overdosage: If you think you have taken too much of this medicine contact a poison control center or emergency room at once. NOTE: This medicine is only for you. Do not share this medicine with others. What if I miss a dose? It is important not to miss your dose. Call your doctor or health care professional if you are unable to keep an appointment. What may interact with this medicine? Do not take this medicine with any of the following medications: -deferoxamine -dimercaprol -other iron products This medicine may also interact with the following medications: -chloramphenicol -deferasirox This list may not describe all possible interactions. Give your health care provider a list of all the medicines, herbs, non-prescription drugs, or dietary supplements you use. Also tell them if you smoke, drink alcohol, or use  illegal drugs. Some items may interact with your medicine. What should I watch for while using this medicine? Visit your doctor or health care professional regularly. Tell your doctor if your symptoms do not start to get better or if they get worse. You may need blood work done while you are taking this medicine. You may need to follow a special diet. Talk to your doctor. Foods that contain iron include: whole grains/cereals, dried fruits, beans, or peas, leafy green vegetables, and organ meats (liver, kidney). What side effects may I notice from receiving this medicine? Side effects that you should report to your doctor or health care professional as soon as possible: -allergic reactions like skin rash, itching or hives, swelling of the face, lips, or tongue -breathing problems -changes in blood pressure -feeling faint or lightheaded, falls -flushing, sweating, or hot feelings Side effects that usually do not require medical attention (report to your doctor or health care professional if they continue or are bothersome): -changes in taste -constipation -dizziness -headache -nausea -pain, redness, or irritation at site where injected -vomiting This list may not describe all possible side effects. Call your doctor for medical advice about side effects. You may report side effects to FDA at 1-800-FDA-1088. Where should I keep my medicine? This drug is given in a hospital or clinic and will not be stored at home. NOTE: This sheet is a summary. It may not cover all possible information. If you have questions about this medicine, talk to your doctor, pharmacist, or health care provider.  2018 Elsevier/Gold Standard (2015-04-14 11:20:47)  

## 2016-09-17 DIAGNOSIS — M5136 Other intervertebral disc degeneration, lumbar region: Secondary | ICD-10-CM | POA: Diagnosis not present

## 2016-09-17 DIAGNOSIS — M069 Rheumatoid arthritis, unspecified: Secondary | ICD-10-CM | POA: Diagnosis not present

## 2016-09-17 DIAGNOSIS — M8580 Other specified disorders of bone density and structure, unspecified site: Secondary | ICD-10-CM | POA: Diagnosis not present

## 2016-09-17 DIAGNOSIS — R296 Repeated falls: Secondary | ICD-10-CM | POA: Diagnosis not present

## 2016-09-17 DIAGNOSIS — M1990 Unspecified osteoarthritis, unspecified site: Secondary | ICD-10-CM | POA: Diagnosis not present

## 2016-09-17 DIAGNOSIS — Z9181 History of falling: Secondary | ICD-10-CM | POA: Diagnosis not present

## 2016-09-18 DIAGNOSIS — Z86711 Personal history of pulmonary embolism: Secondary | ICD-10-CM | POA: Diagnosis not present

## 2016-09-18 DIAGNOSIS — Z7901 Long term (current) use of anticoagulants: Secondary | ICD-10-CM | POA: Diagnosis not present

## 2016-09-19 DIAGNOSIS — Z9181 History of falling: Secondary | ICD-10-CM | POA: Diagnosis not present

## 2016-09-19 DIAGNOSIS — R296 Repeated falls: Secondary | ICD-10-CM | POA: Diagnosis not present

## 2016-09-19 DIAGNOSIS — M8580 Other specified disorders of bone density and structure, unspecified site: Secondary | ICD-10-CM | POA: Diagnosis not present

## 2016-09-19 DIAGNOSIS — M069 Rheumatoid arthritis, unspecified: Secondary | ICD-10-CM | POA: Diagnosis not present

## 2016-09-19 DIAGNOSIS — M5136 Other intervertebral disc degeneration, lumbar region: Secondary | ICD-10-CM | POA: Diagnosis not present

## 2016-09-19 DIAGNOSIS — M1990 Unspecified osteoarthritis, unspecified site: Secondary | ICD-10-CM | POA: Diagnosis not present

## 2016-09-24 DIAGNOSIS — M8580 Other specified disorders of bone density and structure, unspecified site: Secondary | ICD-10-CM | POA: Diagnosis not present

## 2016-09-24 DIAGNOSIS — Z9181 History of falling: Secondary | ICD-10-CM | POA: Diagnosis not present

## 2016-09-24 DIAGNOSIS — R296 Repeated falls: Secondary | ICD-10-CM | POA: Diagnosis not present

## 2016-09-24 DIAGNOSIS — M069 Rheumatoid arthritis, unspecified: Secondary | ICD-10-CM | POA: Diagnosis not present

## 2016-09-24 DIAGNOSIS — M5136 Other intervertebral disc degeneration, lumbar region: Secondary | ICD-10-CM | POA: Diagnosis not present

## 2016-09-24 DIAGNOSIS — M1990 Unspecified osteoarthritis, unspecified site: Secondary | ICD-10-CM | POA: Diagnosis not present

## 2016-09-25 DIAGNOSIS — Z9181 History of falling: Secondary | ICD-10-CM | POA: Diagnosis not present

## 2016-09-25 DIAGNOSIS — R296 Repeated falls: Secondary | ICD-10-CM | POA: Diagnosis not present

## 2016-09-25 DIAGNOSIS — M8580 Other specified disorders of bone density and structure, unspecified site: Secondary | ICD-10-CM | POA: Diagnosis not present

## 2016-09-25 DIAGNOSIS — M5136 Other intervertebral disc degeneration, lumbar region: Secondary | ICD-10-CM | POA: Diagnosis not present

## 2016-09-25 DIAGNOSIS — M1990 Unspecified osteoarthritis, unspecified site: Secondary | ICD-10-CM | POA: Diagnosis not present

## 2016-09-25 DIAGNOSIS — M069 Rheumatoid arthritis, unspecified: Secondary | ICD-10-CM | POA: Diagnosis not present

## 2016-10-01 DIAGNOSIS — M5136 Other intervertebral disc degeneration, lumbar region: Secondary | ICD-10-CM | POA: Diagnosis not present

## 2016-10-01 DIAGNOSIS — M069 Rheumatoid arthritis, unspecified: Secondary | ICD-10-CM | POA: Diagnosis not present

## 2016-10-01 DIAGNOSIS — R296 Repeated falls: Secondary | ICD-10-CM | POA: Diagnosis not present

## 2016-10-01 DIAGNOSIS — Z9181 History of falling: Secondary | ICD-10-CM | POA: Diagnosis not present

## 2016-10-01 DIAGNOSIS — M1990 Unspecified osteoarthritis, unspecified site: Secondary | ICD-10-CM | POA: Diagnosis not present

## 2016-10-01 DIAGNOSIS — M8580 Other specified disorders of bone density and structure, unspecified site: Secondary | ICD-10-CM | POA: Diagnosis not present

## 2016-10-02 DIAGNOSIS — M069 Rheumatoid arthritis, unspecified: Secondary | ICD-10-CM | POA: Diagnosis not present

## 2016-10-02 DIAGNOSIS — Z7901 Long term (current) use of anticoagulants: Secondary | ICD-10-CM | POA: Diagnosis not present

## 2016-10-02 DIAGNOSIS — M5136 Other intervertebral disc degeneration, lumbar region: Secondary | ICD-10-CM | POA: Diagnosis not present

## 2016-10-02 DIAGNOSIS — Z86718 Personal history of other venous thrombosis and embolism: Secondary | ICD-10-CM | POA: Diagnosis not present

## 2016-10-02 DIAGNOSIS — R296 Repeated falls: Secondary | ICD-10-CM | POA: Diagnosis not present

## 2016-10-02 DIAGNOSIS — Z9181 History of falling: Secondary | ICD-10-CM | POA: Diagnosis not present

## 2016-10-02 DIAGNOSIS — M8580 Other specified disorders of bone density and structure, unspecified site: Secondary | ICD-10-CM | POA: Diagnosis not present

## 2016-10-02 DIAGNOSIS — M1990 Unspecified osteoarthritis, unspecified site: Secondary | ICD-10-CM | POA: Diagnosis not present

## 2016-10-03 DIAGNOSIS — M8580 Other specified disorders of bone density and structure, unspecified site: Secondary | ICD-10-CM | POA: Diagnosis not present

## 2016-10-03 DIAGNOSIS — M069 Rheumatoid arthritis, unspecified: Secondary | ICD-10-CM | POA: Diagnosis not present

## 2016-10-03 DIAGNOSIS — R296 Repeated falls: Secondary | ICD-10-CM | POA: Diagnosis not present

## 2016-10-03 DIAGNOSIS — M1990 Unspecified osteoarthritis, unspecified site: Secondary | ICD-10-CM | POA: Diagnosis not present

## 2016-10-03 DIAGNOSIS — Z9181 History of falling: Secondary | ICD-10-CM | POA: Diagnosis not present

## 2016-10-03 DIAGNOSIS — M5136 Other intervertebral disc degeneration, lumbar region: Secondary | ICD-10-CM | POA: Diagnosis not present

## 2016-10-04 DIAGNOSIS — M069 Rheumatoid arthritis, unspecified: Secondary | ICD-10-CM | POA: Diagnosis not present

## 2016-10-04 DIAGNOSIS — Z9181 History of falling: Secondary | ICD-10-CM | POA: Diagnosis not present

## 2016-10-04 DIAGNOSIS — M8580 Other specified disorders of bone density and structure, unspecified site: Secondary | ICD-10-CM | POA: Diagnosis not present

## 2016-10-04 DIAGNOSIS — R296 Repeated falls: Secondary | ICD-10-CM | POA: Diagnosis not present

## 2016-10-04 DIAGNOSIS — M5136 Other intervertebral disc degeneration, lumbar region: Secondary | ICD-10-CM | POA: Diagnosis not present

## 2016-10-04 DIAGNOSIS — M1990 Unspecified osteoarthritis, unspecified site: Secondary | ICD-10-CM | POA: Diagnosis not present

## 2016-10-08 DIAGNOSIS — M069 Rheumatoid arthritis, unspecified: Secondary | ICD-10-CM | POA: Diagnosis not present

## 2016-10-08 DIAGNOSIS — M8580 Other specified disorders of bone density and structure, unspecified site: Secondary | ICD-10-CM | POA: Diagnosis not present

## 2016-10-08 DIAGNOSIS — R296 Repeated falls: Secondary | ICD-10-CM | POA: Diagnosis not present

## 2016-10-08 DIAGNOSIS — M1990 Unspecified osteoarthritis, unspecified site: Secondary | ICD-10-CM | POA: Diagnosis not present

## 2016-10-08 DIAGNOSIS — Z9181 History of falling: Secondary | ICD-10-CM | POA: Diagnosis not present

## 2016-10-08 DIAGNOSIS — M5136 Other intervertebral disc degeneration, lumbar region: Secondary | ICD-10-CM | POA: Diagnosis not present

## 2016-10-09 DIAGNOSIS — Z79899 Other long term (current) drug therapy: Secondary | ICD-10-CM | POA: Diagnosis not present

## 2016-10-09 DIAGNOSIS — F332 Major depressive disorder, recurrent severe without psychotic features: Secondary | ICD-10-CM | POA: Diagnosis not present

## 2016-10-10 DIAGNOSIS — Z9181 History of falling: Secondary | ICD-10-CM | POA: Diagnosis not present

## 2016-10-10 DIAGNOSIS — M8580 Other specified disorders of bone density and structure, unspecified site: Secondary | ICD-10-CM | POA: Diagnosis not present

## 2016-10-10 DIAGNOSIS — M069 Rheumatoid arthritis, unspecified: Secondary | ICD-10-CM | POA: Diagnosis not present

## 2016-10-10 DIAGNOSIS — M1990 Unspecified osteoarthritis, unspecified site: Secondary | ICD-10-CM | POA: Diagnosis not present

## 2016-10-10 DIAGNOSIS — R296 Repeated falls: Secondary | ICD-10-CM | POA: Diagnosis not present

## 2016-10-10 DIAGNOSIS — M5136 Other intervertebral disc degeneration, lumbar region: Secondary | ICD-10-CM | POA: Diagnosis not present

## 2016-10-12 DIAGNOSIS — M5136 Other intervertebral disc degeneration, lumbar region: Secondary | ICD-10-CM | POA: Diagnosis not present

## 2016-10-12 DIAGNOSIS — M8580 Other specified disorders of bone density and structure, unspecified site: Secondary | ICD-10-CM | POA: Diagnosis not present

## 2016-10-12 DIAGNOSIS — M069 Rheumatoid arthritis, unspecified: Secondary | ICD-10-CM | POA: Diagnosis not present

## 2016-10-12 DIAGNOSIS — Z9181 History of falling: Secondary | ICD-10-CM | POA: Diagnosis not present

## 2016-10-12 DIAGNOSIS — M1990 Unspecified osteoarthritis, unspecified site: Secondary | ICD-10-CM | POA: Diagnosis not present

## 2016-10-12 DIAGNOSIS — R296 Repeated falls: Secondary | ICD-10-CM | POA: Diagnosis not present

## 2016-10-15 DIAGNOSIS — M8580 Other specified disorders of bone density and structure, unspecified site: Secondary | ICD-10-CM | POA: Diagnosis not present

## 2016-10-15 DIAGNOSIS — Z9181 History of falling: Secondary | ICD-10-CM | POA: Diagnosis not present

## 2016-10-15 DIAGNOSIS — M5136 Other intervertebral disc degeneration, lumbar region: Secondary | ICD-10-CM | POA: Diagnosis not present

## 2016-10-15 DIAGNOSIS — M1990 Unspecified osteoarthritis, unspecified site: Secondary | ICD-10-CM | POA: Diagnosis not present

## 2016-10-15 DIAGNOSIS — R296 Repeated falls: Secondary | ICD-10-CM | POA: Diagnosis not present

## 2016-10-15 DIAGNOSIS — M069 Rheumatoid arthritis, unspecified: Secondary | ICD-10-CM | POA: Diagnosis not present

## 2016-10-17 DIAGNOSIS — Z9181 History of falling: Secondary | ICD-10-CM | POA: Diagnosis not present

## 2016-10-17 DIAGNOSIS — R296 Repeated falls: Secondary | ICD-10-CM | POA: Diagnosis not present

## 2016-10-17 DIAGNOSIS — M069 Rheumatoid arthritis, unspecified: Secondary | ICD-10-CM | POA: Diagnosis not present

## 2016-10-17 DIAGNOSIS — M1990 Unspecified osteoarthritis, unspecified site: Secondary | ICD-10-CM | POA: Diagnosis not present

## 2016-10-17 DIAGNOSIS — M8580 Other specified disorders of bone density and structure, unspecified site: Secondary | ICD-10-CM | POA: Diagnosis not present

## 2016-10-17 DIAGNOSIS — M5136 Other intervertebral disc degeneration, lumbar region: Secondary | ICD-10-CM | POA: Diagnosis not present

## 2016-10-20 DIAGNOSIS — M069 Rheumatoid arthritis, unspecified: Secondary | ICD-10-CM | POA: Diagnosis not present

## 2016-10-20 DIAGNOSIS — M5136 Other intervertebral disc degeneration, lumbar region: Secondary | ICD-10-CM | POA: Diagnosis not present

## 2016-10-20 DIAGNOSIS — R296 Repeated falls: Secondary | ICD-10-CM | POA: Diagnosis not present

## 2016-10-20 DIAGNOSIS — M8580 Other specified disorders of bone density and structure, unspecified site: Secondary | ICD-10-CM | POA: Diagnosis not present

## 2016-10-20 DIAGNOSIS — Z9181 History of falling: Secondary | ICD-10-CM | POA: Diagnosis not present

## 2016-10-20 DIAGNOSIS — M1990 Unspecified osteoarthritis, unspecified site: Secondary | ICD-10-CM | POA: Diagnosis not present

## 2016-10-23 DIAGNOSIS — M069 Rheumatoid arthritis, unspecified: Secondary | ICD-10-CM | POA: Diagnosis not present

## 2016-10-23 DIAGNOSIS — Z9181 History of falling: Secondary | ICD-10-CM | POA: Diagnosis not present

## 2016-10-23 DIAGNOSIS — M1990 Unspecified osteoarthritis, unspecified site: Secondary | ICD-10-CM | POA: Diagnosis not present

## 2016-10-23 DIAGNOSIS — R296 Repeated falls: Secondary | ICD-10-CM | POA: Diagnosis not present

## 2016-10-23 DIAGNOSIS — M8580 Other specified disorders of bone density and structure, unspecified site: Secondary | ICD-10-CM | POA: Diagnosis not present

## 2016-10-23 DIAGNOSIS — M5136 Other intervertebral disc degeneration, lumbar region: Secondary | ICD-10-CM | POA: Diagnosis not present

## 2016-10-24 ENCOUNTER — Ambulatory Visit (HOSPITAL_BASED_OUTPATIENT_CLINIC_OR_DEPARTMENT_OTHER): Payer: Medicare Other | Admitting: Hematology

## 2016-10-24 ENCOUNTER — Other Ambulatory Visit (HOSPITAL_BASED_OUTPATIENT_CLINIC_OR_DEPARTMENT_OTHER): Payer: Medicare Other

## 2016-10-24 VITALS — BP 161/83 | HR 55 | Temp 98.2°F | Resp 18 | Ht 67.0 in | Wt 188.3 lb

## 2016-10-24 DIAGNOSIS — D500 Iron deficiency anemia secondary to blood loss (chronic): Secondary | ICD-10-CM

## 2016-10-24 DIAGNOSIS — N183 Chronic kidney disease, stage 3 unspecified: Secondary | ICD-10-CM

## 2016-10-24 DIAGNOSIS — D509 Iron deficiency anemia, unspecified: Secondary | ICD-10-CM | POA: Diagnosis not present

## 2016-10-24 DIAGNOSIS — D519 Vitamin B12 deficiency anemia, unspecified: Secondary | ICD-10-CM | POA: Diagnosis not present

## 2016-10-24 DIAGNOSIS — N189 Chronic kidney disease, unspecified: Secondary | ICD-10-CM

## 2016-10-24 DIAGNOSIS — D649 Anemia, unspecified: Secondary | ICD-10-CM

## 2016-10-24 LAB — CBC & DIFF AND RETIC
BASO%: 0.5 % (ref 0.0–2.0)
Basophils Absolute: 0 10*3/uL (ref 0.0–0.1)
EOS%: 1.4 % (ref 0.0–7.0)
Eosinophils Absolute: 0.1 10*3/uL (ref 0.0–0.5)
HCT: 32.6 % — ABNORMAL LOW (ref 38.4–49.9)
HGB: 10.3 g/dL — ABNORMAL LOW (ref 13.0–17.1)
Immature Retic Fract: 7 % (ref 3.00–10.60)
LYMPH#: 1.6 10*3/uL (ref 0.9–3.3)
LYMPH%: 18.8 % (ref 14.0–49.0)
MCH: 30.3 pg (ref 27.2–33.4)
MCHC: 31.6 g/dL — AB (ref 32.0–36.0)
MCV: 95.9 fL (ref 79.3–98.0)
MONO#: 0.7 10*3/uL (ref 0.1–0.9)
MONO%: 7.7 % (ref 0.0–14.0)
NEUT#: 6 10*3/uL (ref 1.5–6.5)
NEUT%: 71.6 % (ref 39.0–75.0)
Platelets: 138 10*3/uL — ABNORMAL LOW (ref 140–400)
RBC: 3.4 10*6/uL — AB (ref 4.20–5.82)
RDW: 16.3 % — ABNORMAL HIGH (ref 11.0–14.6)
RETIC %: 1.15 % (ref 0.80–1.80)
RETIC CT ABS: 39.1 10*3/uL (ref 34.80–93.90)
WBC: 8.4 10*3/uL (ref 4.0–10.3)

## 2016-10-24 LAB — COMPREHENSIVE METABOLIC PANEL
ALT: 12 U/L (ref 0–55)
AST: 20 U/L (ref 5–34)
Albumin: 3.5 g/dL (ref 3.5–5.0)
Alkaline Phosphatase: 62 U/L (ref 40–150)
Anion Gap: 7 mEq/L (ref 3–11)
BUN: 18.8 mg/dL (ref 7.0–26.0)
CHLORIDE: 108 meq/L (ref 98–109)
CO2: 25 meq/L (ref 22–29)
CREATININE: 1.6 mg/dL — AB (ref 0.7–1.3)
Calcium: 9.2 mg/dL (ref 8.4–10.4)
EGFR: 39 mL/min/{1.73_m2} — ABNORMAL LOW (ref >90)
Glucose: 101 mg/dl (ref 70–140)
Potassium: 4.1 mEq/L (ref 3.5–5.1)
Sodium: 140 mEq/L (ref 136–145)
Total Bilirubin: 0.37 mg/dL (ref 0.20–1.20)
Total Protein: 6.8 g/dL (ref 6.4–8.3)

## 2016-10-24 LAB — IRON AND TIBC
%SAT: 20 % (ref 20–55)
Iron: 43 ug/dL (ref 42–163)
TIBC: 216 ug/dL (ref 202–409)
UIBC: 173 ug/dL (ref 117–376)

## 2016-10-24 LAB — FERRITIN
FERRITIN: 471 ng/mL — AB (ref 22–316)
Ferritin: 474 ng/ml — ABNORMAL HIGH (ref 22–316)

## 2016-10-25 ENCOUNTER — Telehealth: Payer: Self-pay | Admitting: Hematology

## 2016-10-25 ENCOUNTER — Telehealth: Payer: Self-pay

## 2016-10-25 DIAGNOSIS — R296 Repeated falls: Secondary | ICD-10-CM | POA: Diagnosis not present

## 2016-10-25 DIAGNOSIS — Z9181 History of falling: Secondary | ICD-10-CM | POA: Diagnosis not present

## 2016-10-25 DIAGNOSIS — M8580 Other specified disorders of bone density and structure, unspecified site: Secondary | ICD-10-CM | POA: Diagnosis not present

## 2016-10-25 DIAGNOSIS — M5136 Other intervertebral disc degeneration, lumbar region: Secondary | ICD-10-CM | POA: Diagnosis not present

## 2016-10-25 DIAGNOSIS — M069 Rheumatoid arthritis, unspecified: Secondary | ICD-10-CM | POA: Diagnosis not present

## 2016-10-25 DIAGNOSIS — M1990 Unspecified osteoarthritis, unspecified site: Secondary | ICD-10-CM | POA: Diagnosis not present

## 2016-10-25 LAB — VITAMIN B12: Vitamin B12: 347 pg/mL (ref 232–1245)

## 2016-10-25 NOTE — Telephone Encounter (Signed)
Spoke with patient regarding his appointment in November and transferred him to Dr.Kale's nurse to discuss lab results.

## 2016-10-25 NOTE — Telephone Encounter (Signed)
Pt called this AM requesting lab results from yesterday. Note sent to Dr. Irene Limbo requesting he let me know if he needs me to respond to patient regarding labs.

## 2016-10-26 DIAGNOSIS — R296 Repeated falls: Secondary | ICD-10-CM | POA: Diagnosis not present

## 2016-10-26 DIAGNOSIS — Z9181 History of falling: Secondary | ICD-10-CM | POA: Diagnosis not present

## 2016-10-26 DIAGNOSIS — M5136 Other intervertebral disc degeneration, lumbar region: Secondary | ICD-10-CM | POA: Diagnosis not present

## 2016-10-26 DIAGNOSIS — M8580 Other specified disorders of bone density and structure, unspecified site: Secondary | ICD-10-CM | POA: Diagnosis not present

## 2016-10-26 DIAGNOSIS — M1990 Unspecified osteoarthritis, unspecified site: Secondary | ICD-10-CM | POA: Diagnosis not present

## 2016-10-26 DIAGNOSIS — M069 Rheumatoid arthritis, unspecified: Secondary | ICD-10-CM | POA: Diagnosis not present

## 2016-10-28 NOTE — Progress Notes (Signed)
[facility]  HEMATOLOGY ONCOLOGY PROGRESS NOTE  Date of service: .08/29/2016  Patient Care Team: [name], MD as PCP - General (Internal Medicine)  CC: f/u for Anemia  INTERVAL HISTORY:  Patient is a 83 year old male who is here for follow-up of his anemia. He was previously seen by Dr. Thana Farr on 07/03/2016 for chronic anemia with a hemoglobin in the low 9 range. He had a bone marrow biopsy done recently due to concerns for possible myelodysplastic syndrome. He also has chronic kidney disease with a creatinine of about 1.5 and seronegative rheumatoid arthritis. He is your for follow-up of his bone marrow biopsy results and further management of his anemia.  REVIEW OF SYSTEMS:    10 Point review of systems of done and is negative except as noted above.  . Past Medical History:  Diagnosis Date  . Arthritis   . Cancer ([place])   . Clotting disorder ([place])   . Depression     . Past Surgical History:  Procedure Laterality Date  . PROSTATE SURGERY    . SPINE SURGERY      . Social History  Substance Use Topics  . Smoking status: Former Research scientist (life sciences)  . Smokeless tobacco: Former Systems developer    Quit date: 03/25/1957  . Alcohol use No    ALLERGIES:  is allergic to ibuprofen; oxaprozin; and phenelzine sulfate.  MEDICATIONS:  Current Outpatient Prescriptions  Medication Sig Dispense Refill  . ALPRAZolam (XANAX) 0.25 MG tablet Take 0.25 mg by mouth at bedtime as needed for sleep.    . ARIPiprazole (ABILIFY) 2 MG tablet Take 1 mg by mouth daily.    . Cholecalciferol (VITAMIN D3) 2000 UNITS TABS Take by mouth daily.    Marland Kitchen lithium carbonate 150 MG capsule Take 150 mg by mouth 2 (two) times daily.    . Multiple Vitamin (MULTIVITAMIN) tablet Take 1 tablet by mouth daily.    . predniSONE (DELTASONE) 2.5 MG tablet TAKE 1 TABLET BY MOUTH WITH FOOD OR MILK EVERY DAY  1  . sertraline (ZOLOFT) 100 MG tablet Take 100 mg by mouth daily.    . traMADol (ULTRAM) 50 MG tablet Take 50 mg by mouth.    .  warfarin (COUMADIN) 5 MG tablet Take by mouth.     No current facility-administered medications for this visit.     PHYSICAL EXAMINATION: ECOG PERFORMANCE STATUS: 2 - Symptomatic, <50% confined to bed  . Vitals:   08/29/16 1324  BP: (!) 147/66  Pulse: 61  Resp: 18  Temp: 98.4 F (36.9 C)    Filed Weights   08/29/16 1324  Weight: 186 lb 6.4 oz (84.6 kg)   .Body mass index is 29.19 kg/m.  GENERAL:alert, in no acute distress and comfortable SKIN: no acute rashes, no significant lesions EYES: conjunctiva are pink and non-injected, sclera anicteric OROPHARYNX: MMM, no exudates, no oropharyngeal erythema or ulceration NECK: supple, no JVD LYMPH:  no palpable lymphadenopathy in the cervical, axillary or inguinal regions LUNGS: clear to auscultation b/l with normal respiratory effort HEART: regular rate & rhythm ABDOMEN:  normoactive bowel sounds , non tender, not distended. Extremity: no pedal edema PSYCH: alert & oriented x 3 with fluent speech NEURO: no focal motor/sensory deficits  LABORATORY DATA:   I have reviewed the data as listed  . CBC Latest Ref Rng & Units 08/29/2016 07/23/2016  WBC 4.0 - 10.3 10e3/uL 8.4 8.3  Hemoglobin 13.0 - 17.1 g/dL 9.1(L) 9.2(L)  Hematocrit 38.4 - 49.9 % 29.6(L) 29.0(L)  Platelets 140 - 400 10e3/uL  221 201    . CMP Latest Ref Rng & Units 08/29/2016 07/23/2016  Glucose 70 - 140 mg/dl 96 99  BUN 7.0 - 26.0 mg/dL 26.2(H) 26(H)  Creatinine 0.7 - 1.3 mg/dL 1.7(H) 1.72(H)  Sodium 136 - 145 mEq/L 140 138  Potassium 3.5 - 5.1 mEq/L 4.6 4.3  Chloride 101 - 111 mmol/L - 107  CO2 22 - 29 mEq/L 24 24  Calcium 8.4 - 10.4 mg/dL 9.6 9.2  Total Protein 6.4 - 8.3 g/dL 6.8 -  Total Bilirubin 0.20 - 1.20 mg/dL 0.26 -  Alkaline Phos 40 - 150 U/L 57 -  AST 5 - 34 U/L 20 -  ALT 0 - 55 U/L 12 -   .  Component     Latest Ref Rng & Units 06/05/2016 06/12/2016  Total Protein     6.0 - 8.5 g/dL  6.4  Albumin     2.9 - 4.4 g/dL  3.4  Alpha 1     0.0 -  0.4 g/dL  0.2  Alpha 2     0.4 - 1.0 g/dL  0.8  Beta     0.7 - 1.3 g/dL  1.1  Gamma Globulin     0.4 - 1.8 g/dL  0.9  M-SPIKE, %     Not Observed g/dL  Not Observed  Globulin, Total     2.2 - 3.9 g/dL  3.0  A/G Ratio     0.7 - 1.7  1.1  Please Note:       Comment  Interpretation       Comment  Ig Kappa Free Light Chain     3.3 - 19.4 mg/L  31.3 (H)  Ig Lambda Free Light Chain     5.7 - 26.3 mg/L  30.0 (H)  Kappa/Lambda FluidC Ratio     0.26 - 1.65  1.04  Erythropoietin     2.6 - 18.5 mIU/mL 28.3 (H)   Vitamin B12     232 - 1,245 pg/mL 365   Methylmalonic Acid, Serum     0 - 378 nmol/L 288   ANA Ab, IFA      Negative   LDH     125 - 245 U/L  158      RADIOGRAPHIC STUDIES: I have personally reviewed the radiological images as listed and agreed with the findings in the report. No results found.  ASSESSMENT & PLAN:   83 year old male with  #1 Chronic normocytic normochromic anemia  Bone marrow examination shows no overt evidence of myelodysplastic syndrome or other infiltrative process. Labs as well as bone marrow examination showed evidence of iron deficiency. Part of the anemia could also be anemia of chronic inflammation from his seronegative rheumatoid arthritis as well as chronic kidney disease. Plan  -Bone marrow examination results were discussed in detail with the patient and his accompanying family. -We discussed the presence of some iron deficiency in the sectioning of chronic inflammation and chronic kidney disease has not responded well to his oral iron and thus far. -We decided to offer him IV iron replacement which she is agreeable with. -We'll set him up for IV injectafer weekly 2 doses with a goal to keep his ferritin level more than 100. -If patient has anemia with a hemoglobin 9 or below, despite ferritin of more than 100, might consider the possible use of erythropoietin for anemia of chronic disease. -Patient was recommended to take B complex  1 tablet by mouth daily to support accelerated hematopoiesis with IV iron --  We shall see him back in about 8 weeks with labs to evaluate for treatment response. -Multiple questions and concerns brought up by the patient and his family were answered in details to their apparent satisfaction.  #2  Patient Active Problem List   Diagnosis Date Noted  . Iron deficiency anemia due to chronic blood loss 08/29/2016  . BRADYCARDIA 07/19/2008  . DIZZINESS 07/19/2008  . FATIGUE 07/19/2008  . Shortness of breath 07/19/2008  . DEPRESSION 07/15/2008  . PULMONARY EMBOLISM 07/15/2008  . DVT 07/15/2008  . ARTHRITIS, RHEUMATOID 07/15/2008   -Continue follow-up with primary care physician    I spent 30 minutes counseling the patient face to face. The total time spent in the appointment was 40 minutes and more than 50% was on counseling and direct patient cares.    Sullivan Lone MD [place] AAHIVMS [facility] Hematology/Oncology Physician [facility]  (Office):       [phone] (Work cell):  [phone] (Fax):           [phone]

## 2016-10-28 NOTE — Progress Notes (Signed)
[facility]  HEMATOLOGY ONCOLOGY PROGRESS NOTE  Date of service: .10/24/2016  Patient Care Team: [name], MD as PCP - General (Internal Medicine)  CC: f/u for Anemia  INTERVAL HISTORY:  Patient is here for follow-up of his anemia. He tolerated his IV iron well with no acute concerns. His hemoglobin has improved from 9.1 to 10.3 and he feels somewhat better. No other acute new concerns noted. Eating okay.  REVIEW OF SYSTEMS:    10 Point review of systems of done and is negative except as noted above.  . Past Medical History:  Diagnosis Date  . Arthritis   . Cancer ([place])   . Clotting disorder ([place])   . Depression     . Past Surgical History:  Procedure Laterality Date  . PROSTATE SURGERY    . SPINE SURGERY      . Social History  Substance Use Topics  . Smoking status: Former Research scientist (life sciences)  . Smokeless tobacco: Former Systems developer    Quit date: 03/25/1957  . Alcohol use No    ALLERGIES:  is allergic to ibuprofen; oxaprozin; and phenelzine sulfate.  MEDICATIONS:  Current Outpatient Prescriptions  Medication Sig Dispense Refill  . ALPRAZolam (XANAX) 0.25 MG tablet Take 0.25 mg by mouth at bedtime as needed for sleep.    . ARIPiprazole (ABILIFY) 2 MG tablet Take 1 mg by mouth daily.    . Cholecalciferol (VITAMIN D3) 2000 UNITS TABS Take by mouth daily.    Marland Kitchen lithium carbonate 150 MG capsule Take 150 mg by mouth 2 (two) times daily.    . Multiple Vitamin (MULTIVITAMIN) tablet Take 1 tablet by mouth daily.    . predniSONE (DELTASONE) 2.5 MG tablet TAKE 1 TABLET BY MOUTH WITH FOOD OR MILK EVERY DAY  1  . sertraline (ZOLOFT) 100 MG tablet Take 100 mg by mouth daily.    . traMADol (ULTRAM) 50 MG tablet Take 50 mg by mouth.    . warfarin (COUMADIN) 5 MG tablet Take by mouth.     No current facility-administered medications for this visit.     PHYSICAL EXAMINATION: ECOG PERFORMANCE STATUS: 2 - Symptomatic, <50% confined to bed  . Vitals:   10/24/16 1418  BP: (!) 161/83    Pulse: (!) 55  Resp: 18  Temp: 98.2 F (36.8 C)    Filed Weights   10/24/16 1418  Weight: 188 lb 4.8 oz (85.4 kg)   .Body mass index is 29.49 kg/m.  GENERAL:alert, in no acute distress and comfortable SKIN: no acute rashes, no significant lesions EYES: conjunctiva are pink and non-injected, sclera anicteric OROPHARYNX: MMM, no exudates, no oropharyngeal erythema or ulceration NECK: supple, no JVD LYMPH:  no palpable lymphadenopathy in the cervical, axillary or inguinal regions LUNGS: clear to auscultation b/l with normal respiratory effort HEART: regular rate & rhythm ABDOMEN:  normoactive bowel sounds , non tender, not distended. Extremity: no pedal edema PSYCH: alert & oriented x 3 with fluent speech NEURO: no focal motor/sensory deficits  LABORATORY DATA:   I have reviewed the data as listed  . CBC Latest Ref Rng & Units 10/24/2016 08/29/2016 07/23/2016  WBC 4.0 - 10.3 10e3/uL 8.4 8.4 8.3  Hemoglobin 13.0 - 17.1 g/dL 10.3(L) 9.1(L) 9.2(L)  Hematocrit 38.4 - 49.9 % 32.6(L) 29.6(L) 29.0(L)  Platelets 140 - 400 10e3/uL 138(L) 221 201    CBC    Component Value Date/Time   WBC 8.4 10/24/2016 1351   WBC 8.3 07/23/2016 0913   RBC 3.40 (L) 10/24/2016 1351   RBC 2.90 (  L) 07/23/2016 0913   HGB 10.3 (L) 10/24/2016 1351   HCT 32.6 (L) 10/24/2016 1351   PLT 138 (L) 10/24/2016 1351   MCV 95.9 10/24/2016 1351   MCH 30.3 10/24/2016 1351   MCH 31.7 07/23/2016 0913   MCHC 31.6 (L) 10/24/2016 1351   MCHC 31.7 07/23/2016 0913   RDW 16.3 (H) 10/24/2016 1351   LYMPHSABS 1.6 10/24/2016 1351   MONOABS 0.7 10/24/2016 1351   EOSABS 0.1 10/24/2016 1351   BASOSABS 0.0 10/24/2016 1351   . CMP Latest Ref Rng & Units 10/24/2016 08/29/2016 07/23/2016  Glucose 70 - 140 mg/dl 101 96 99  BUN 7.0 - 26.0 mg/dL 18.8 26.2(H) 26(H)  Creatinine 0.7 - 1.3 mg/dL 1.6(H) 1.7(H) 1.72(H)  Sodium 136 - 145 mEq/L 140 140 138  Potassium 3.5 - 5.1 mEq/L 4.1 4.6 4.3  Chloride 101 - 111 mmol/L - - 107  CO2  22 - 29 mEq/L 25 24 24   Calcium 8.4 - 10.4 mg/dL 9.2 9.6 9.2  Total Protein 6.4 - 8.3 g/dL 6.8 6.8 -  Total Bilirubin 0.20 - 1.20 mg/dL 0.37 0.26 -  Alkaline Phos 40 - 150 U/L 62 57 -  AST 5 - 34 U/L 20 20 -  ALT 0 - 55 U/L 12 12 -    . Lab Results  Component Value Date   IRON 43 10/24/2016   TIBC 216 10/24/2016   IRONPCTSAT 20 10/24/2016   (Iron and TIBC)  Lab Results  Component Value Date   FERRITIN 471 (H) 10/24/2016    .  Component     Latest Ref Rng & Units 06/05/2016 06/12/2016  Total Protein     6.0 - 8.5 g/dL  6.4  Albumin     2.9 - 4.4 g/dL  3.4  Alpha 1     0.0 - 0.4 g/dL  0.2  Alpha 2     0.4 - 1.0 g/dL  0.8  Beta     0.7 - 1.3 g/dL  1.1  Gamma Globulin     0.4 - 1.8 g/dL  0.9  M-SPIKE, %     Not Observed g/dL  Not Observed  Globulin, Total     2.2 - 3.9 g/dL  3.0  A/G Ratio     0.7 - 1.7  1.1  Please Note:       Comment  Interpretation       Comment  Ig Kappa Free Light Chain     3.3 - 19.4 mg/L  31.3 (H)  Ig Lambda Free Light Chain     5.7 - 26.3 mg/L  30.0 (H)  Kappa/Lambda FluidC Ratio     0.26 - 1.65  1.04  Erythropoietin     2.6 - 18.5 mIU/mL 28.3 (H)   Vitamin B12     232 - 1,245 pg/mL 365   Methylmalonic Acid, Serum     0 - 378 nmol/L 288   ANA Ab, IFA      Negative   LDH     125 - 245 U/L  158      RADIOGRAPHIC STUDIES: I have personally reviewed the radiological images as listed and agreed with the findings in the report. No results found.  ASSESSMENT & PLAN:   83 year old male with  #1 Chronic normocytic normochromic anemia Hemoglobin has improved from 9.1 to 10.3 after IV iron. Bone marrow examination shows no overt evidence of myelodysplastic syndrome or other infiltrative process. Labs as well as bone marrow examination showed evidence of iron deficiency.  Part of the anemia could also be anemia of chronic inflammation from his seronegative rheumatoid arthritis as well as chronic kidney disease.  #2 Iron  deficiency anemia- ferritin levels at goal after IV iron. No problems tolerating IV injectafer, Plan -Labs were reviewed with the patient and his daughter. -They are glad that his hemoglobin is somewhat better after the IV iron and that he feels somewhat better too. -No indication for PRBC transfusions or additional IV iron at this time. -We'll hold off on erythropoietin unless absolutely needed . -f/u labs and treat with a goal to keep his ferritin level more than 100. -If patient has anemia with a hemoglobin 9 or below, despite ferritin of more than 100, might consider the possible use of erythropoietin for anemia of chronic disease. -Patient was recommended to take B complex 1 tablet by mouth daily  #2  Patient Active Problem List   Diagnosis Date Noted  . Iron deficiency anemia due to chronic blood loss 08/29/2016  . BRADYCARDIA 07/19/2008  . DIZZINESS 07/19/2008  . FATIGUE 07/19/2008  . Shortness of breath 07/19/2008  . DEPRESSION 07/15/2008  . PULMONARY EMBOLISM 07/15/2008  . DVT 07/15/2008  . ARTHRITIS, RHEUMATOID 07/15/2008   -Continue follow-up with primary care physician  RTC with Dr Irene Limbo in 3 months with labs   I spent 30 minutes counseling the patient face to face. The total time spent in the appointment was 40 minutes and more than 50% was on counseling and direct patient cares.    Sullivan Lone MD [place] AAHIVMS [facility] Hematology/Oncology Physician [facility]  (Office):       [phone] (Work cell):  [phone] (Fax):           [phone]

## 2016-10-29 DIAGNOSIS — M069 Rheumatoid arthritis, unspecified: Secondary | ICD-10-CM | POA: Diagnosis not present

## 2016-10-29 DIAGNOSIS — R296 Repeated falls: Secondary | ICD-10-CM | POA: Diagnosis not present

## 2016-10-29 DIAGNOSIS — M1990 Unspecified osteoarthritis, unspecified site: Secondary | ICD-10-CM | POA: Diagnosis not present

## 2016-10-29 DIAGNOSIS — M8580 Other specified disorders of bone density and structure, unspecified site: Secondary | ICD-10-CM | POA: Diagnosis not present

## 2016-10-29 DIAGNOSIS — Z9181 History of falling: Secondary | ICD-10-CM | POA: Diagnosis not present

## 2016-10-29 DIAGNOSIS — M5136 Other intervertebral disc degeneration, lumbar region: Secondary | ICD-10-CM | POA: Diagnosis not present

## 2016-10-30 DIAGNOSIS — Z86718 Personal history of other venous thrombosis and embolism: Secondary | ICD-10-CM | POA: Diagnosis not present

## 2016-10-30 DIAGNOSIS — Z7901 Long term (current) use of anticoagulants: Secondary | ICD-10-CM | POA: Diagnosis not present

## 2016-10-31 DIAGNOSIS — M8580 Other specified disorders of bone density and structure, unspecified site: Secondary | ICD-10-CM | POA: Diagnosis not present

## 2016-10-31 DIAGNOSIS — M5136 Other intervertebral disc degeneration, lumbar region: Secondary | ICD-10-CM | POA: Diagnosis not present

## 2016-10-31 DIAGNOSIS — M1990 Unspecified osteoarthritis, unspecified site: Secondary | ICD-10-CM | POA: Diagnosis not present

## 2016-10-31 DIAGNOSIS — Z9181 History of falling: Secondary | ICD-10-CM | POA: Diagnosis not present

## 2016-10-31 DIAGNOSIS — R296 Repeated falls: Secondary | ICD-10-CM | POA: Diagnosis not present

## 2016-10-31 DIAGNOSIS — M069 Rheumatoid arthritis, unspecified: Secondary | ICD-10-CM | POA: Diagnosis not present

## 2016-11-01 DIAGNOSIS — M069 Rheumatoid arthritis, unspecified: Secondary | ICD-10-CM | POA: Diagnosis not present

## 2016-11-01 DIAGNOSIS — R296 Repeated falls: Secondary | ICD-10-CM | POA: Diagnosis not present

## 2016-11-01 DIAGNOSIS — M1990 Unspecified osteoarthritis, unspecified site: Secondary | ICD-10-CM | POA: Diagnosis not present

## 2016-11-01 DIAGNOSIS — M5136 Other intervertebral disc degeneration, lumbar region: Secondary | ICD-10-CM | POA: Diagnosis not present

## 2016-11-01 DIAGNOSIS — Z9181 History of falling: Secondary | ICD-10-CM | POA: Diagnosis not present

## 2016-11-01 DIAGNOSIS — M8580 Other specified disorders of bone density and structure, unspecified site: Secondary | ICD-10-CM | POA: Diagnosis not present

## 2016-11-05 DIAGNOSIS — M1990 Unspecified osteoarthritis, unspecified site: Secondary | ICD-10-CM | POA: Diagnosis not present

## 2016-11-05 DIAGNOSIS — M5136 Other intervertebral disc degeneration, lumbar region: Secondary | ICD-10-CM | POA: Diagnosis not present

## 2016-11-05 DIAGNOSIS — R296 Repeated falls: Secondary | ICD-10-CM | POA: Diagnosis not present

## 2016-11-05 DIAGNOSIS — Z9181 History of falling: Secondary | ICD-10-CM | POA: Diagnosis not present

## 2016-11-05 DIAGNOSIS — M069 Rheumatoid arthritis, unspecified: Secondary | ICD-10-CM | POA: Diagnosis not present

## 2016-11-05 DIAGNOSIS — M8580 Other specified disorders of bone density and structure, unspecified site: Secondary | ICD-10-CM | POA: Diagnosis not present

## 2016-11-06 DIAGNOSIS — M8580 Other specified disorders of bone density and structure, unspecified site: Secondary | ICD-10-CM | POA: Diagnosis not present

## 2016-11-06 DIAGNOSIS — M069 Rheumatoid arthritis, unspecified: Secondary | ICD-10-CM | POA: Diagnosis not present

## 2016-11-06 DIAGNOSIS — Z9181 History of falling: Secondary | ICD-10-CM | POA: Diagnosis not present

## 2016-11-06 DIAGNOSIS — R296 Repeated falls: Secondary | ICD-10-CM | POA: Diagnosis not present

## 2016-11-06 DIAGNOSIS — M5136 Other intervertebral disc degeneration, lumbar region: Secondary | ICD-10-CM | POA: Diagnosis not present

## 2016-11-06 DIAGNOSIS — M1990 Unspecified osteoarthritis, unspecified site: Secondary | ICD-10-CM | POA: Diagnosis not present

## 2016-11-12 DIAGNOSIS — Z9181 History of falling: Secondary | ICD-10-CM | POA: Diagnosis not present

## 2016-11-12 DIAGNOSIS — R296 Repeated falls: Secondary | ICD-10-CM | POA: Diagnosis not present

## 2016-11-12 DIAGNOSIS — M5136 Other intervertebral disc degeneration, lumbar region: Secondary | ICD-10-CM | POA: Diagnosis not present

## 2016-11-12 DIAGNOSIS — M8580 Other specified disorders of bone density and structure, unspecified site: Secondary | ICD-10-CM | POA: Diagnosis not present

## 2016-11-12 DIAGNOSIS — M069 Rheumatoid arthritis, unspecified: Secondary | ICD-10-CM | POA: Diagnosis not present

## 2016-11-12 DIAGNOSIS — M1990 Unspecified osteoarthritis, unspecified site: Secondary | ICD-10-CM | POA: Diagnosis not present

## 2016-11-15 DIAGNOSIS — M069 Rheumatoid arthritis, unspecified: Secondary | ICD-10-CM | POA: Diagnosis not present

## 2016-11-15 DIAGNOSIS — Z9181 History of falling: Secondary | ICD-10-CM | POA: Diagnosis not present

## 2016-11-15 DIAGNOSIS — M1990 Unspecified osteoarthritis, unspecified site: Secondary | ICD-10-CM | POA: Diagnosis not present

## 2016-11-15 DIAGNOSIS — R296 Repeated falls: Secondary | ICD-10-CM | POA: Diagnosis not present

## 2016-11-15 DIAGNOSIS — M8580 Other specified disorders of bone density and structure, unspecified site: Secondary | ICD-10-CM | POA: Diagnosis not present

## 2016-11-15 DIAGNOSIS — M5136 Other intervertebral disc degeneration, lumbar region: Secondary | ICD-10-CM | POA: Diagnosis not present

## 2016-11-19 DIAGNOSIS — M1990 Unspecified osteoarthritis, unspecified site: Secondary | ICD-10-CM | POA: Diagnosis not present

## 2016-11-19 DIAGNOSIS — M5136 Other intervertebral disc degeneration, lumbar region: Secondary | ICD-10-CM | POA: Diagnosis not present

## 2016-11-19 DIAGNOSIS — M8580 Other specified disorders of bone density and structure, unspecified site: Secondary | ICD-10-CM | POA: Diagnosis not present

## 2016-11-19 DIAGNOSIS — R296 Repeated falls: Secondary | ICD-10-CM | POA: Diagnosis not present

## 2016-11-19 DIAGNOSIS — M069 Rheumatoid arthritis, unspecified: Secondary | ICD-10-CM | POA: Diagnosis not present

## 2016-11-19 DIAGNOSIS — Z9181 History of falling: Secondary | ICD-10-CM | POA: Diagnosis not present

## 2016-11-20 DIAGNOSIS — D649 Anemia, unspecified: Secondary | ICD-10-CM | POA: Diagnosis not present

## 2016-11-20 DIAGNOSIS — N183 Chronic kidney disease, stage 3 (moderate): Secondary | ICD-10-CM | POA: Diagnosis not present

## 2016-11-20 DIAGNOSIS — E782 Mixed hyperlipidemia: Secondary | ICD-10-CM | POA: Diagnosis not present

## 2016-11-22 DIAGNOSIS — M8580 Other specified disorders of bone density and structure, unspecified site: Secondary | ICD-10-CM | POA: Diagnosis not present

## 2016-11-22 DIAGNOSIS — M1990 Unspecified osteoarthritis, unspecified site: Secondary | ICD-10-CM | POA: Diagnosis not present

## 2016-11-22 DIAGNOSIS — Z9181 History of falling: Secondary | ICD-10-CM | POA: Diagnosis not present

## 2016-11-22 DIAGNOSIS — M5136 Other intervertebral disc degeneration, lumbar region: Secondary | ICD-10-CM | POA: Diagnosis not present

## 2016-11-22 DIAGNOSIS — M0609 Rheumatoid arthritis without rheumatoid factor, multiple sites: Secondary | ICD-10-CM | POA: Diagnosis not present

## 2016-11-22 DIAGNOSIS — M150 Primary generalized (osteo)arthritis: Secondary | ICD-10-CM | POA: Diagnosis not present

## 2016-11-22 DIAGNOSIS — M069 Rheumatoid arthritis, unspecified: Secondary | ICD-10-CM | POA: Diagnosis not present

## 2016-11-22 DIAGNOSIS — Z79899 Other long term (current) drug therapy: Secondary | ICD-10-CM | POA: Diagnosis not present

## 2016-11-22 DIAGNOSIS — R296 Repeated falls: Secondary | ICD-10-CM | POA: Diagnosis not present

## 2016-11-28 DIAGNOSIS — Z9181 History of falling: Secondary | ICD-10-CM | POA: Diagnosis not present

## 2016-11-28 DIAGNOSIS — M069 Rheumatoid arthritis, unspecified: Secondary | ICD-10-CM | POA: Diagnosis not present

## 2016-11-28 DIAGNOSIS — M5136 Other intervertebral disc degeneration, lumbar region: Secondary | ICD-10-CM | POA: Diagnosis not present

## 2016-11-28 DIAGNOSIS — R296 Repeated falls: Secondary | ICD-10-CM | POA: Diagnosis not present

## 2016-11-28 DIAGNOSIS — M1990 Unspecified osteoarthritis, unspecified site: Secondary | ICD-10-CM | POA: Diagnosis not present

## 2016-11-28 DIAGNOSIS — M8580 Other specified disorders of bone density and structure, unspecified site: Secondary | ICD-10-CM | POA: Diagnosis not present

## 2016-11-29 DIAGNOSIS — M1990 Unspecified osteoarthritis, unspecified site: Secondary | ICD-10-CM | POA: Diagnosis not present

## 2016-11-29 DIAGNOSIS — Z7901 Long term (current) use of anticoagulants: Secondary | ICD-10-CM | POA: Diagnosis not present

## 2016-11-29 DIAGNOSIS — M8580 Other specified disorders of bone density and structure, unspecified site: Secondary | ICD-10-CM | POA: Diagnosis not present

## 2016-11-29 DIAGNOSIS — M5136 Other intervertebral disc degeneration, lumbar region: Secondary | ICD-10-CM | POA: Diagnosis not present

## 2016-11-29 DIAGNOSIS — R296 Repeated falls: Secondary | ICD-10-CM | POA: Diagnosis not present

## 2016-11-29 DIAGNOSIS — Z86718 Personal history of other venous thrombosis and embolism: Secondary | ICD-10-CM | POA: Diagnosis not present

## 2016-11-29 DIAGNOSIS — M069 Rheumatoid arthritis, unspecified: Secondary | ICD-10-CM | POA: Diagnosis not present

## 2016-11-29 DIAGNOSIS — Z9181 History of falling: Secondary | ICD-10-CM | POA: Diagnosis not present

## 2016-12-01 DIAGNOSIS — R296 Repeated falls: Secondary | ICD-10-CM | POA: Diagnosis not present

## 2016-12-01 DIAGNOSIS — M5136 Other intervertebral disc degeneration, lumbar region: Secondary | ICD-10-CM | POA: Diagnosis not present

## 2016-12-01 DIAGNOSIS — M8580 Other specified disorders of bone density and structure, unspecified site: Secondary | ICD-10-CM | POA: Diagnosis not present

## 2016-12-01 DIAGNOSIS — M069 Rheumatoid arthritis, unspecified: Secondary | ICD-10-CM | POA: Diagnosis not present

## 2016-12-01 DIAGNOSIS — Z9181 History of falling: Secondary | ICD-10-CM | POA: Diagnosis not present

## 2016-12-01 DIAGNOSIS — M1990 Unspecified osteoarthritis, unspecified site: Secondary | ICD-10-CM | POA: Diagnosis not present

## 2016-12-04 DIAGNOSIS — M069 Rheumatoid arthritis, unspecified: Secondary | ICD-10-CM | POA: Diagnosis not present

## 2016-12-04 DIAGNOSIS — Z9181 History of falling: Secondary | ICD-10-CM | POA: Diagnosis not present

## 2016-12-04 DIAGNOSIS — R296 Repeated falls: Secondary | ICD-10-CM | POA: Diagnosis not present

## 2016-12-04 DIAGNOSIS — M8580 Other specified disorders of bone density and structure, unspecified site: Secondary | ICD-10-CM | POA: Diagnosis not present

## 2016-12-04 DIAGNOSIS — M5136 Other intervertebral disc degeneration, lumbar region: Secondary | ICD-10-CM | POA: Diagnosis not present

## 2016-12-04 DIAGNOSIS — M1990 Unspecified osteoarthritis, unspecified site: Secondary | ICD-10-CM | POA: Diagnosis not present

## 2016-12-05 DIAGNOSIS — M8580 Other specified disorders of bone density and structure, unspecified site: Secondary | ICD-10-CM | POA: Diagnosis not present

## 2016-12-05 DIAGNOSIS — D509 Iron deficiency anemia, unspecified: Secondary | ICD-10-CM | POA: Diagnosis not present

## 2016-12-05 DIAGNOSIS — R296 Repeated falls: Secondary | ICD-10-CM | POA: Diagnosis not present

## 2016-12-05 DIAGNOSIS — Z23 Encounter for immunization: Secondary | ICD-10-CM | POA: Diagnosis not present

## 2016-12-05 DIAGNOSIS — M5136 Other intervertebral disc degeneration, lumbar region: Secondary | ICD-10-CM | POA: Diagnosis not present

## 2016-12-05 DIAGNOSIS — N183 Chronic kidney disease, stage 3 (moderate): Secondary | ICD-10-CM | POA: Diagnosis not present

## 2016-12-05 DIAGNOSIS — Z9181 History of falling: Secondary | ICD-10-CM | POA: Diagnosis not present

## 2016-12-05 DIAGNOSIS — E782 Mixed hyperlipidemia: Secondary | ICD-10-CM | POA: Diagnosis not present

## 2016-12-05 DIAGNOSIS — M069 Rheumatoid arthritis, unspecified: Secondary | ICD-10-CM | POA: Diagnosis not present

## 2016-12-05 DIAGNOSIS — M1990 Unspecified osteoarthritis, unspecified site: Secondary | ICD-10-CM | POA: Diagnosis not present

## 2016-12-06 DIAGNOSIS — M1990 Unspecified osteoarthritis, unspecified site: Secondary | ICD-10-CM | POA: Diagnosis not present

## 2016-12-06 DIAGNOSIS — M8580 Other specified disorders of bone density and structure, unspecified site: Secondary | ICD-10-CM | POA: Diagnosis not present

## 2016-12-06 DIAGNOSIS — Z9181 History of falling: Secondary | ICD-10-CM | POA: Diagnosis not present

## 2016-12-06 DIAGNOSIS — M069 Rheumatoid arthritis, unspecified: Secondary | ICD-10-CM | POA: Diagnosis not present

## 2016-12-06 DIAGNOSIS — R296 Repeated falls: Secondary | ICD-10-CM | POA: Diagnosis not present

## 2016-12-06 DIAGNOSIS — M5136 Other intervertebral disc degeneration, lumbar region: Secondary | ICD-10-CM | POA: Diagnosis not present

## 2016-12-11 DIAGNOSIS — R296 Repeated falls: Secondary | ICD-10-CM | POA: Diagnosis not present

## 2016-12-11 DIAGNOSIS — M8580 Other specified disorders of bone density and structure, unspecified site: Secondary | ICD-10-CM | POA: Diagnosis not present

## 2016-12-11 DIAGNOSIS — M1990 Unspecified osteoarthritis, unspecified site: Secondary | ICD-10-CM | POA: Diagnosis not present

## 2016-12-11 DIAGNOSIS — Z9181 History of falling: Secondary | ICD-10-CM | POA: Diagnosis not present

## 2016-12-11 DIAGNOSIS — M069 Rheumatoid arthritis, unspecified: Secondary | ICD-10-CM | POA: Diagnosis not present

## 2016-12-11 DIAGNOSIS — M5136 Other intervertebral disc degeneration, lumbar region: Secondary | ICD-10-CM | POA: Diagnosis not present

## 2016-12-12 DIAGNOSIS — R296 Repeated falls: Secondary | ICD-10-CM | POA: Diagnosis not present

## 2016-12-12 DIAGNOSIS — M5136 Other intervertebral disc degeneration, lumbar region: Secondary | ICD-10-CM | POA: Diagnosis not present

## 2016-12-12 DIAGNOSIS — Z9181 History of falling: Secondary | ICD-10-CM | POA: Diagnosis not present

## 2016-12-12 DIAGNOSIS — M1990 Unspecified osteoarthritis, unspecified site: Secondary | ICD-10-CM | POA: Diagnosis not present

## 2016-12-12 DIAGNOSIS — M8580 Other specified disorders of bone density and structure, unspecified site: Secondary | ICD-10-CM | POA: Diagnosis not present

## 2016-12-12 DIAGNOSIS — M069 Rheumatoid arthritis, unspecified: Secondary | ICD-10-CM | POA: Diagnosis not present

## 2016-12-13 DIAGNOSIS — Z86711 Personal history of pulmonary embolism: Secondary | ICD-10-CM | POA: Diagnosis not present

## 2016-12-13 DIAGNOSIS — Z86718 Personal history of other venous thrombosis and embolism: Secondary | ICD-10-CM | POA: Diagnosis not present

## 2016-12-13 DIAGNOSIS — Z7901 Long term (current) use of anticoagulants: Secondary | ICD-10-CM | POA: Diagnosis not present

## 2016-12-14 DIAGNOSIS — M1990 Unspecified osteoarthritis, unspecified site: Secondary | ICD-10-CM | POA: Diagnosis not present

## 2016-12-14 DIAGNOSIS — M8580 Other specified disorders of bone density and structure, unspecified site: Secondary | ICD-10-CM | POA: Diagnosis not present

## 2016-12-14 DIAGNOSIS — M5136 Other intervertebral disc degeneration, lumbar region: Secondary | ICD-10-CM | POA: Diagnosis not present

## 2016-12-14 DIAGNOSIS — R296 Repeated falls: Secondary | ICD-10-CM | POA: Diagnosis not present

## 2016-12-14 DIAGNOSIS — M069 Rheumatoid arthritis, unspecified: Secondary | ICD-10-CM | POA: Diagnosis not present

## 2016-12-14 DIAGNOSIS — Z9181 History of falling: Secondary | ICD-10-CM | POA: Diagnosis not present

## 2016-12-17 DIAGNOSIS — M5136 Other intervertebral disc degeneration, lumbar region: Secondary | ICD-10-CM | POA: Diagnosis not present

## 2016-12-17 DIAGNOSIS — M1990 Unspecified osteoarthritis, unspecified site: Secondary | ICD-10-CM | POA: Diagnosis not present

## 2016-12-17 DIAGNOSIS — R296 Repeated falls: Secondary | ICD-10-CM | POA: Diagnosis not present

## 2016-12-17 DIAGNOSIS — Z9181 History of falling: Secondary | ICD-10-CM | POA: Diagnosis not present

## 2016-12-17 DIAGNOSIS — M8580 Other specified disorders of bone density and structure, unspecified site: Secondary | ICD-10-CM | POA: Diagnosis not present

## 2016-12-17 DIAGNOSIS — M069 Rheumatoid arthritis, unspecified: Secondary | ICD-10-CM | POA: Diagnosis not present

## 2016-12-19 DIAGNOSIS — M069 Rheumatoid arthritis, unspecified: Secondary | ICD-10-CM | POA: Diagnosis not present

## 2016-12-19 DIAGNOSIS — M1990 Unspecified osteoarthritis, unspecified site: Secondary | ICD-10-CM | POA: Diagnosis not present

## 2016-12-19 DIAGNOSIS — R296 Repeated falls: Secondary | ICD-10-CM | POA: Diagnosis not present

## 2016-12-19 DIAGNOSIS — M8580 Other specified disorders of bone density and structure, unspecified site: Secondary | ICD-10-CM | POA: Diagnosis not present

## 2016-12-19 DIAGNOSIS — M5136 Other intervertebral disc degeneration, lumbar region: Secondary | ICD-10-CM | POA: Diagnosis not present

## 2016-12-19 DIAGNOSIS — Z9181 History of falling: Secondary | ICD-10-CM | POA: Diagnosis not present

## 2016-12-21 DIAGNOSIS — Z9181 History of falling: Secondary | ICD-10-CM | POA: Diagnosis not present

## 2016-12-21 DIAGNOSIS — M1990 Unspecified osteoarthritis, unspecified site: Secondary | ICD-10-CM | POA: Diagnosis not present

## 2016-12-21 DIAGNOSIS — M5136 Other intervertebral disc degeneration, lumbar region: Secondary | ICD-10-CM | POA: Diagnosis not present

## 2016-12-21 DIAGNOSIS — M8580 Other specified disorders of bone density and structure, unspecified site: Secondary | ICD-10-CM | POA: Diagnosis not present

## 2016-12-21 DIAGNOSIS — M069 Rheumatoid arthritis, unspecified: Secondary | ICD-10-CM | POA: Diagnosis not present

## 2016-12-21 DIAGNOSIS — R296 Repeated falls: Secondary | ICD-10-CM | POA: Diagnosis not present

## 2016-12-24 DIAGNOSIS — R296 Repeated falls: Secondary | ICD-10-CM | POA: Diagnosis not present

## 2016-12-24 DIAGNOSIS — M1990 Unspecified osteoarthritis, unspecified site: Secondary | ICD-10-CM | POA: Diagnosis not present

## 2016-12-24 DIAGNOSIS — M8580 Other specified disorders of bone density and structure, unspecified site: Secondary | ICD-10-CM | POA: Diagnosis not present

## 2016-12-24 DIAGNOSIS — Z9181 History of falling: Secondary | ICD-10-CM | POA: Diagnosis not present

## 2016-12-24 DIAGNOSIS — M5136 Other intervertebral disc degeneration, lumbar region: Secondary | ICD-10-CM | POA: Diagnosis not present

## 2016-12-24 DIAGNOSIS — M069 Rheumatoid arthritis, unspecified: Secondary | ICD-10-CM | POA: Diagnosis not present

## 2016-12-25 DIAGNOSIS — M5136 Other intervertebral disc degeneration, lumbar region: Secondary | ICD-10-CM | POA: Diagnosis not present

## 2016-12-25 DIAGNOSIS — R296 Repeated falls: Secondary | ICD-10-CM | POA: Diagnosis not present

## 2016-12-25 DIAGNOSIS — Z9181 History of falling: Secondary | ICD-10-CM | POA: Diagnosis not present

## 2016-12-25 DIAGNOSIS — M8580 Other specified disorders of bone density and structure, unspecified site: Secondary | ICD-10-CM | POA: Diagnosis not present

## 2016-12-25 DIAGNOSIS — M069 Rheumatoid arthritis, unspecified: Secondary | ICD-10-CM | POA: Diagnosis not present

## 2016-12-25 DIAGNOSIS — M1990 Unspecified osteoarthritis, unspecified site: Secondary | ICD-10-CM | POA: Diagnosis not present

## 2016-12-28 DIAGNOSIS — M8580 Other specified disorders of bone density and structure, unspecified site: Secondary | ICD-10-CM | POA: Diagnosis not present

## 2016-12-28 DIAGNOSIS — M5136 Other intervertebral disc degeneration, lumbar region: Secondary | ICD-10-CM | POA: Diagnosis not present

## 2016-12-28 DIAGNOSIS — M1990 Unspecified osteoarthritis, unspecified site: Secondary | ICD-10-CM | POA: Diagnosis not present

## 2016-12-28 DIAGNOSIS — Z9181 History of falling: Secondary | ICD-10-CM | POA: Diagnosis not present

## 2016-12-28 DIAGNOSIS — M069 Rheumatoid arthritis, unspecified: Secondary | ICD-10-CM | POA: Diagnosis not present

## 2016-12-28 DIAGNOSIS — R296 Repeated falls: Secondary | ICD-10-CM | POA: Diagnosis not present

## 2016-12-31 DIAGNOSIS — Z9181 History of falling: Secondary | ICD-10-CM | POA: Diagnosis not present

## 2016-12-31 DIAGNOSIS — M1990 Unspecified osteoarthritis, unspecified site: Secondary | ICD-10-CM | POA: Diagnosis not present

## 2016-12-31 DIAGNOSIS — M069 Rheumatoid arthritis, unspecified: Secondary | ICD-10-CM | POA: Diagnosis not present

## 2016-12-31 DIAGNOSIS — M5136 Other intervertebral disc degeneration, lumbar region: Secondary | ICD-10-CM | POA: Diagnosis not present

## 2016-12-31 DIAGNOSIS — M8580 Other specified disorders of bone density and structure, unspecified site: Secondary | ICD-10-CM | POA: Diagnosis not present

## 2016-12-31 DIAGNOSIS — R296 Repeated falls: Secondary | ICD-10-CM | POA: Diagnosis not present

## 2017-01-03 DIAGNOSIS — R296 Repeated falls: Secondary | ICD-10-CM | POA: Diagnosis not present

## 2017-01-03 DIAGNOSIS — M5136 Other intervertebral disc degeneration, lumbar region: Secondary | ICD-10-CM | POA: Diagnosis not present

## 2017-01-03 DIAGNOSIS — M069 Rheumatoid arthritis, unspecified: Secondary | ICD-10-CM | POA: Diagnosis not present

## 2017-01-03 DIAGNOSIS — Z9181 History of falling: Secondary | ICD-10-CM | POA: Diagnosis not present

## 2017-01-03 DIAGNOSIS — M1990 Unspecified osteoarthritis, unspecified site: Secondary | ICD-10-CM | POA: Diagnosis not present

## 2017-01-03 DIAGNOSIS — M8580 Other specified disorders of bone density and structure, unspecified site: Secondary | ICD-10-CM | POA: Diagnosis not present

## 2017-01-08 DIAGNOSIS — R296 Repeated falls: Secondary | ICD-10-CM | POA: Diagnosis not present

## 2017-01-08 DIAGNOSIS — M5136 Other intervertebral disc degeneration, lumbar region: Secondary | ICD-10-CM | POA: Diagnosis not present

## 2017-01-08 DIAGNOSIS — M8580 Other specified disorders of bone density and structure, unspecified site: Secondary | ICD-10-CM | POA: Diagnosis not present

## 2017-01-08 DIAGNOSIS — Z9181 History of falling: Secondary | ICD-10-CM | POA: Diagnosis not present

## 2017-01-08 DIAGNOSIS — M1990 Unspecified osteoarthritis, unspecified site: Secondary | ICD-10-CM | POA: Diagnosis not present

## 2017-01-08 DIAGNOSIS — M069 Rheumatoid arthritis, unspecified: Secondary | ICD-10-CM | POA: Diagnosis not present

## 2017-01-10 DIAGNOSIS — Z7901 Long term (current) use of anticoagulants: Secondary | ICD-10-CM | POA: Diagnosis not present

## 2017-01-10 DIAGNOSIS — Z86718 Personal history of other venous thrombosis and embolism: Secondary | ICD-10-CM | POA: Diagnosis not present

## 2017-01-11 DIAGNOSIS — M1990 Unspecified osteoarthritis, unspecified site: Secondary | ICD-10-CM | POA: Diagnosis not present

## 2017-01-11 DIAGNOSIS — Z9181 History of falling: Secondary | ICD-10-CM | POA: Diagnosis not present

## 2017-01-11 DIAGNOSIS — M5136 Other intervertebral disc degeneration, lumbar region: Secondary | ICD-10-CM | POA: Diagnosis not present

## 2017-01-11 DIAGNOSIS — M8580 Other specified disorders of bone density and structure, unspecified site: Secondary | ICD-10-CM | POA: Diagnosis not present

## 2017-01-11 DIAGNOSIS — R296 Repeated falls: Secondary | ICD-10-CM | POA: Diagnosis not present

## 2017-01-11 DIAGNOSIS — M069 Rheumatoid arthritis, unspecified: Secondary | ICD-10-CM | POA: Diagnosis not present

## 2017-01-15 DIAGNOSIS — M5136 Other intervertebral disc degeneration, lumbar region: Secondary | ICD-10-CM | POA: Diagnosis not present

## 2017-01-15 DIAGNOSIS — M8580 Other specified disorders of bone density and structure, unspecified site: Secondary | ICD-10-CM | POA: Diagnosis not present

## 2017-01-15 DIAGNOSIS — Z9181 History of falling: Secondary | ICD-10-CM | POA: Diagnosis not present

## 2017-01-15 DIAGNOSIS — M1990 Unspecified osteoarthritis, unspecified site: Secondary | ICD-10-CM | POA: Diagnosis not present

## 2017-01-15 DIAGNOSIS — M069 Rheumatoid arthritis, unspecified: Secondary | ICD-10-CM | POA: Diagnosis not present

## 2017-01-15 DIAGNOSIS — R296 Repeated falls: Secondary | ICD-10-CM | POA: Diagnosis not present

## 2017-01-17 DIAGNOSIS — M069 Rheumatoid arthritis, unspecified: Secondary | ICD-10-CM | POA: Diagnosis not present

## 2017-01-17 DIAGNOSIS — M5136 Other intervertebral disc degeneration, lumbar region: Secondary | ICD-10-CM | POA: Diagnosis not present

## 2017-01-17 DIAGNOSIS — M8580 Other specified disorders of bone density and structure, unspecified site: Secondary | ICD-10-CM | POA: Diagnosis not present

## 2017-01-17 DIAGNOSIS — M1990 Unspecified osteoarthritis, unspecified site: Secondary | ICD-10-CM | POA: Diagnosis not present

## 2017-01-17 DIAGNOSIS — Z9181 History of falling: Secondary | ICD-10-CM | POA: Diagnosis not present

## 2017-01-17 DIAGNOSIS — R296 Repeated falls: Secondary | ICD-10-CM | POA: Diagnosis not present

## 2017-01-23 DIAGNOSIS — M069 Rheumatoid arthritis, unspecified: Secondary | ICD-10-CM | POA: Diagnosis not present

## 2017-01-23 DIAGNOSIS — M5136 Other intervertebral disc degeneration, lumbar region: Secondary | ICD-10-CM | POA: Diagnosis not present

## 2017-01-23 DIAGNOSIS — M8580 Other specified disorders of bone density and structure, unspecified site: Secondary | ICD-10-CM | POA: Diagnosis not present

## 2017-01-23 DIAGNOSIS — M1990 Unspecified osteoarthritis, unspecified site: Secondary | ICD-10-CM | POA: Diagnosis not present

## 2017-01-23 DIAGNOSIS — R296 Repeated falls: Secondary | ICD-10-CM | POA: Diagnosis not present

## 2017-01-23 DIAGNOSIS — Z9181 History of falling: Secondary | ICD-10-CM | POA: Diagnosis not present

## 2017-01-24 NOTE — Progress Notes (Signed)
Marland Kitchen  HEMATOLOGY ONCOLOGY PROGRESS NOTE  Date of service: 01/25/17  Patient Care Team: [name], MD as PCP - General (Internal Medicine)  CC: f/u for Anemia  INTERVAL HISTORY:  Patient is here for follow-up of his anemia. He is accompanied by his family member today. He reports that he is doing well overall. He is not taking the B complex as prescribed, but since his last visit he has purchased B complex vitamins. He is having issues with his chronic RA who he is evaluated by Dr. Dossie Der and is prescribed prednisone daily and xeljanz. He notes that his chronic RA is keeping him from playing golf, but he is able to putt. He will be completing physical therapy next week and notes that he had improvement with his balance following physical therapy. He is still on warfarin at this time.    On review of systems, he reports chronic RA pain. His daughter reports that the patient had transient CP last week that they contributed to dehydration. He reports that he had a pressure sensation to his left sided chest without radiation to his arm. He was at rest when the CP came on and he went to sleep and woke up without CP. He denies SOB or diaphoresis at the time of the CP. He denies being evaluated for his CP. He advised his physical therapist, whom stated that they will inform his PCP regarding this matter. He will follow up with his PCP today. He has had to have cardiology follow up in the past. He denies exertional CP. He denies prior hx of GERD. He denies abdominal pain. He reports that he is intermittently constipated.      REVIEW OF SYSTEMS:    10 Point review of systems of done and is negative except as noted above.  . Past Medical History:  Diagnosis Date  . Arthritis   . Cancer ([place])   . Clotting disorder ([place])   . Depression     . Past Surgical History:  Procedure Laterality Date  . PROSTATE SURGERY    . SPINE SURGERY      . Social History  Substance Use Topics  . Smoking  status: Former Research scientist (life sciences)  . Smokeless tobacco: Former Systems developer    Quit date: 03/25/1957  . Alcohol use No    ALLERGIES:  is allergic to ibuprofen; oxaprozin; and phenelzine sulfate.  MEDICATIONS:  Current Outpatient Medications  Medication Sig Dispense Refill  . ALPRAZolam (XANAX) 0.25 MG tablet Take 0.25 mg by mouth at bedtime as needed for sleep.    . ARIPiprazole (ABILIFY) 2 MG tablet Take 2.5 mg by mouth every other day.     . Cholecalciferol (VITAMIN D3) 2000 UNITS TABS Take by mouth daily.    Marland Kitchen lithium carbonate 150 MG capsule Take 225 mg by mouth daily.     . Multiple Vitamin (MULTIVITAMIN) tablet Take 1 tablet by mouth daily.    . predniSONE (DELTASONE) 2.5 MG tablet TAKE 1 TABLET BY MOUTH WITH FOOD OR MILK EVERY DAY  1  . sertraline (ZOLOFT) 100 MG tablet Take 100 mg by mouth daily.    . Tofacitinib Citrate (XELJANZ) 5 MG TABS Take by mouth daily. Pt unsure of dose.    . traMADol (ULTRAM) 50 MG tablet Take 50 mg by mouth.    . warfarin (COUMADIN) 5 MG tablet Take by mouth. 5 MG daily except 2.5 MG on Mondays and Fridays.     No current facility-administered medications for this visit.  PHYSICAL EXAMINATION:  ECOG PERFORMANCE STATUS: 2 - Symptomatic, <50% confined to bed  . Vitals:   01/25/17 1018  BP: (!) 117/57  Pulse: (!) 52  Resp: 18  Temp: 98.1 F (36.7 C)  SpO2: 98%    Filed Weights   01/25/17 1018  Weight: 188 lb 3.2 oz (85.4 kg)   .Body mass index is 29.48 kg/m.  GENERAL:alert, in no acute distress and comfortable SKIN: no acute rashes, no significant lesions EYES: conjunctiva are pink and non-injected, sclera anicteric OROPHARYNX: MMM, no exudates, no oropharyngeal erythema or ulceration NECK: supple, no JVD LYMPH:  no palpable lymphadenopathy in the cervical, axillary or inguinal regions LUNGS: clear to auscultation b/l with normal respiratory effort.  HEART: regular rate & rhythm. No chest wall tenderness.  ABDOMEN:  normoactive bowel sounds , non  tender, not distended. Extremity: no pedal edema PSYCH: alert & oriented x 3 with fluent speech NEURO: no focal motor/sensory deficits  LABORATORY DATA:   I have reviewed the data as listed  . CBC Latest Ref Rng & Units 01/25/2017 10/24/2016 08/29/2016  WBC 4.0 - 10.3 10e3/uL 6.7 8.4 8.4  Hemoglobin 13.0 - 17.1 g/dL 11.4(L) 10.3(L) 9.1(L)  Hematocrit 38.4 - 49.9 % 35.4(L) 32.6(L) 29.6(L)  Platelets 140 - 400 10e3/uL 124(L) 138(L) 221    CBC    Component Value Date/Time   WBC 6.7 01/25/2017 1001   WBC 8.3 07/23/2016 0913   RBC 3.68 (L) 01/25/2017 1001   RBC 2.90 (L) 07/23/2016 0913   HGB 11.4 (L) 01/25/2017 1001   HCT 35.4 (L) 01/25/2017 1001   PLT 124 (L) 01/25/2017 1001   MCV 96.2 01/25/2017 1001   MCH 31.0 01/25/2017 1001   MCH 31.7 07/23/2016 0913   MCHC 32.2 01/25/2017 1001   MCHC 31.7 07/23/2016 0913   RDW 14.2 01/25/2017 1001   LYMPHSABS 2.7 01/25/2017 1001   MONOABS 0.5 01/25/2017 1001   EOSABS 0.2 01/25/2017 1001   BASOSABS 0.0 01/25/2017 1001   . CMP Latest Ref Rng & Units 01/25/2017 10/24/2016 08/29/2016  Glucose 70 - 140 mg/dl 84 101 96  BUN 7.0 - 26.0 mg/dL 20.0 18.8 26.2(H)  Creatinine 0.7 - 1.3 mg/dL 1.6(H) 1.6(H) 1.7(H)  Sodium 136 - 145 mEq/L 140 140 140  Potassium 3.5 - 5.1 mEq/L 4.3 4.1 4.6  Chloride 101 - 111 mmol/L - - -  CO2 22 - 29 mEq/L 23 25 24   Calcium 8.4 - 10.4 mg/dL 9.4 9.2 9.6  Total Protein 6.4 - 8.3 g/dL 7.0 6.8 6.8  Total Bilirubin 0.20 - 1.20 mg/dL 0.32 0.37 0.26  Alkaline Phos 40 - 150 U/L 61 62 57  AST 5 - 34 U/L 20 20 20   ALT 0 - 55 U/L 14 12 12     . Lab Results  Component Value Date   IRON 74 01/25/2017   TIBC 261 01/25/2017   IRONPCTSAT 28 01/25/2017   (Iron and TIBC)  Lab Results  Component Value Date   FERRITIN 367 (H) 01/25/2017    .  Component     Latest Ref Rng & Units 06/05/2016 06/12/2016  Total Protein     6.0 - 8.5 g/dL  6.4  Albumin     2.9 - 4.4 g/dL  3.4  Alpha 1     0.0 - 0.4 g/dL  0.2  Alpha 2      0.4 - 1.0 g/dL  0.8  Beta     0.7 - 1.3 g/dL  1.1  Gamma Globulin  0.4 - 1.8 g/dL  0.9  M-SPIKE, %     Not Observed g/dL  Not Observed  Globulin, Total     2.2 - 3.9 g/dL  3.0  A/G Ratio     0.7 - 1.7  1.1  Please Note:       Comment  Interpretation       Comment  Ig Kappa Free Light Chain     3.3 - 19.4 mg/L  31.3 (H)  Ig Lambda Free Light Chain     5.7 - 26.3 mg/L  30.0 (H)  Kappa/Lambda FluidC Ratio     0.26 - 1.65  1.04  Erythropoietin     2.6 - 18.5 mIU/mL 28.3 (H)   Vitamin B12     232 - 1,245 pg/mL 365   Methylmalonic Acid, Serum     0 - 378 nmol/L 288   ANA Ab, IFA      Negative   LDH     125 - 245 U/L  158      RADIOGRAPHIC STUDIES: I have personally reviewed the radiological images as listed and agreed with the findings in the report. No results found.  ASSESSMENT & PLAN:   83 year old male with  #1 Chronic normocytic normochromic anemia Hemoglobin has improved from 9.1 to 10.3 to 11.4 after IV iron. Bone marrow examination shows no overt evidence of myelodysplastic syndrome or other infiltrative process. Labs as well as bone marrow examination showed evidence of iron deficiency. Part of the anemia could also be anemia of chronic inflammation from his seronegative rheumatoid arthritis as well as chronic kidney disease. -hemoglobin at 11.4 as of today, 01/25/2017   #2 Iron deficiency anemia- ferritin levels at goal after IV iron. No problems tolerating IV injectafer, Plan -Labs were reviewed with the patient and his daughter. -They are glad that his hemoglobin is somewhat better after the IV iron and that he feels somewhat better too. -No indication for PRBC transfusions or additional IV iron at this time. -We'll hold off on erythropoietin unless absolutely needed . -f/u labs and treat with a goal to keep his ferritin level more than 100. (ferritin today >100 and therefore no need for additional IV iron at this time). -If patient has anemia with a  hemoglobin 9 or below, despite ferritin of more than 100, might consider the possible use of erythropoietin for anemia of chronic disease. -Patient was previously recommended to take B complex 1 tablet by mouth daily and this was reiterated today.   #2  Patient Active Problem List   Diagnosis Date Noted  . Iron deficiency anemia due to chronic blood loss 08/29/2016  . BRADYCARDIA 07/19/2008  . DIZZINESS 07/19/2008  . FATIGUE 07/19/2008  . Shortness of breath 07/19/2008  . DEPRESSION 07/15/2008  . PULMONARY EMBOLISM 07/15/2008  . DVT 07/15/2008  . ARTHRITIS, RHEUMATOID 07/15/2008   -Continue follow-up with primary care physician  RTC with Dr. Irene Limbo in 6 months with labs.    I spent 20 minutes counseling the patient face to face. The total time spent in the appointment was 25 minutes and more than 50% was on counseling and direct patient cares.    Sullivan Lone MD [place] AAHIVMS [facility] Hematology/Oncology Physician [facility]  (Office):       [phone] (Work cell):  [phone] (Fax):           [phone]  This document serves as a record of services personally performed by Sullivan Lone, MD. It was created on his  behalf by Steva Colder, a trained medical scribe. The creation of this record is based on the scribe's personal observations and the provider's statements to them. This document has been checked and approved by the attending provider.   .I have reviewed the above documentation for accuracy and completeness, and I agree with the above.  Brunetta Genera MD MS

## 2017-01-25 ENCOUNTER — Other Ambulatory Visit (HOSPITAL_BASED_OUTPATIENT_CLINIC_OR_DEPARTMENT_OTHER): Payer: Medicare Other

## 2017-01-25 ENCOUNTER — Telehealth: Payer: Self-pay

## 2017-01-25 ENCOUNTER — Encounter: Payer: Self-pay | Admitting: Hematology

## 2017-01-25 ENCOUNTER — Ambulatory Visit (HOSPITAL_BASED_OUTPATIENT_CLINIC_OR_DEPARTMENT_OTHER): Payer: Medicare Other | Admitting: Hematology

## 2017-01-25 VITALS — BP 117/57 | HR 52 | Temp 98.1°F | Resp 18 | Ht 67.0 in | Wt 188.2 lb

## 2017-01-25 DIAGNOSIS — D509 Iron deficiency anemia, unspecified: Secondary | ICD-10-CM

## 2017-01-25 DIAGNOSIS — M069 Rheumatoid arthritis, unspecified: Secondary | ICD-10-CM | POA: Diagnosis not present

## 2017-01-25 DIAGNOSIS — D649 Anemia, unspecified: Secondary | ICD-10-CM

## 2017-01-25 DIAGNOSIS — D500 Iron deficiency anemia secondary to blood loss (chronic): Secondary | ICD-10-CM

## 2017-01-25 LAB — IRON AND TIBC
%SAT: 28 % (ref 20–55)
IRON: 74 ug/dL (ref 42–163)
TIBC: 261 ug/dL (ref 202–409)
UIBC: 187 ug/dL (ref 117–376)

## 2017-01-25 LAB — CBC & DIFF AND RETIC
BASO%: 0.4 % (ref 0.0–2.0)
BASOS ABS: 0 10*3/uL (ref 0.0–0.1)
EOS%: 2.8 % (ref 0.0–7.0)
Eosinophils Absolute: 0.2 10*3/uL (ref 0.0–0.5)
HEMATOCRIT: 35.4 % — AB (ref 38.4–49.9)
HGB: 11.4 g/dL — ABNORMAL LOW (ref 13.0–17.1)
Immature Retic Fract: 8.1 % (ref 3.00–10.60)
LYMPH%: 40.2 % (ref 14.0–49.0)
MCH: 31 pg (ref 27.2–33.4)
MCHC: 32.2 g/dL (ref 32.0–36.0)
MCV: 96.2 fL (ref 79.3–98.0)
MONO#: 0.5 10*3/uL (ref 0.1–0.9)
MONO%: 8.1 % (ref 0.0–14.0)
NEUT#: 3.2 10*3/uL (ref 1.5–6.5)
NEUT%: 48.5 % (ref 39.0–75.0)
PLATELETS: 124 10*3/uL — AB (ref 140–400)
RBC: 3.68 10*6/uL — ABNORMAL LOW (ref 4.20–5.82)
RDW: 14.2 % (ref 11.0–14.6)
RETIC CT ABS: 49.68 10*3/uL (ref 34.80–93.90)
Retic %: 1.35 % (ref 0.80–1.80)
WBC: 6.7 10*3/uL (ref 4.0–10.3)
lymph#: 2.7 10*3/uL (ref 0.9–3.3)

## 2017-01-25 LAB — COMPREHENSIVE METABOLIC PANEL
ALBUMIN: 3.6 g/dL (ref 3.5–5.0)
ALK PHOS: 61 U/L (ref 40–150)
ALT: 14 U/L (ref 0–55)
ANION GAP: 8 meq/L (ref 3–11)
AST: 20 U/L (ref 5–34)
BUN: 20 mg/dL (ref 7.0–26.0)
CALCIUM: 9.4 mg/dL (ref 8.4–10.4)
CHLORIDE: 108 meq/L (ref 98–109)
CO2: 23 mEq/L (ref 22–29)
Creatinine: 1.6 mg/dL — ABNORMAL HIGH (ref 0.7–1.3)
EGFR: 38 mL/min/{1.73_m2} — ABNORMAL LOW (ref >60)
Glucose: 84 mg/dl (ref 70–140)
Potassium: 4.3 mEq/L (ref 3.5–5.1)
Sodium: 140 mEq/L (ref 136–145)
Total Bilirubin: 0.32 mg/dL (ref 0.20–1.20)
Total Protein: 7 g/dL (ref 6.4–8.3)

## 2017-01-25 LAB — FERRITIN: FERRITIN: 367 ng/mL — AB (ref 22–316)

## 2017-01-25 NOTE — Patient Instructions (Signed)
Thank you for choosing [facility] to provide your oncology and hematology care.  To afford each patient quality time with our providers, please arrive 30 minutes before your scheduled appointment time.  If you arrive late for your appointment, you may be asked to reschedule.  We strive to give you quality time with our providers, and arriving late affects you and other patients whose appointments are after yours.   If you are a no show for multiple scheduled visits, you may be dismissed from the clinic at the providers discretion.    Again, thank you for choosing [facility], our hope is that these requests will decrease the amount of time that you wait before being seen by our physicians.  ______________________________________________________________________  Should you have questions after your visit to the [facility], please contact our office at [phone] between the hours of 8:30 and 4:30 p.m.    Voicemails left after 4:30p.m will not be returned until the following business day.    For prescription refill requests, please have your pharmacy contact us directly.  Please also try to allow 48 hours for prescription requests.    Please contact the scheduling department for questions regarding scheduling.  For scheduling of procedures such as PET scans, CT scans, MRI, Ultrasound, etc please contact central scheduling at [phone].    Resources For Cancer Patients and Caregivers:   Oncolink.org:  A wonderful resource for patients and healthcare providers for information regarding your disease, ways to tract your treatment, what to expect, etc.     American Cancer Society:  800-227-2345  Can help patients locate various types of support and financial assistance  Cancer Care: 1-800-813-HOPE (4673) Provides financial assistance, online support groups, medication/co-pay assistance.    [place] DSS:  336-641-3447 Where to apply for food  stamps, Medicaid, and utility assistance  Medicare Rights Center: 800-333-4114 Helps people with Medicare understand their rights and benefits, navigate the Medicare system, and secure the quality healthcare they deserve  SCAT: 336-333-6589 [place] [employer]'s shared-ride transportation service for eligible riders who have a disability that prevents them from riding the fixed route bus.    For additional information on assistance programs please contact our social worker:   Grier Hock/Abigail Elmore:  [phone]            

## 2017-01-25 NOTE — Telephone Encounter (Signed)
Printed avs and calender for upcoming appointment per 11/2 los

## 2017-01-28 DIAGNOSIS — M1990 Unspecified osteoarthritis, unspecified site: Secondary | ICD-10-CM | POA: Diagnosis not present

## 2017-01-28 DIAGNOSIS — R296 Repeated falls: Secondary | ICD-10-CM | POA: Diagnosis not present

## 2017-01-28 DIAGNOSIS — M5136 Other intervertebral disc degeneration, lumbar region: Secondary | ICD-10-CM | POA: Diagnosis not present

## 2017-01-28 DIAGNOSIS — M069 Rheumatoid arthritis, unspecified: Secondary | ICD-10-CM | POA: Diagnosis not present

## 2017-01-28 DIAGNOSIS — M8580 Other specified disorders of bone density and structure, unspecified site: Secondary | ICD-10-CM | POA: Diagnosis not present

## 2017-01-28 DIAGNOSIS — Z9181 History of falling: Secondary | ICD-10-CM | POA: Diagnosis not present

## 2017-02-06 DIAGNOSIS — F323 Major depressive disorder, single episode, severe with psychotic features: Secondary | ICD-10-CM | POA: Diagnosis not present

## 2017-02-07 DIAGNOSIS — Z7901 Long term (current) use of anticoagulants: Secondary | ICD-10-CM | POA: Diagnosis not present

## 2017-02-07 DIAGNOSIS — Z86718 Personal history of other venous thrombosis and embolism: Secondary | ICD-10-CM | POA: Diagnosis not present

## 2017-02-07 DIAGNOSIS — Z86711 Personal history of pulmonary embolism: Secondary | ICD-10-CM | POA: Diagnosis not present

## 2017-03-07 DIAGNOSIS — Z86718 Personal history of other venous thrombosis and embolism: Secondary | ICD-10-CM | POA: Diagnosis not present

## 2017-03-07 DIAGNOSIS — Z7901 Long term (current) use of anticoagulants: Secondary | ICD-10-CM | POA: Diagnosis not present

## 2017-03-07 DIAGNOSIS — Z86711 Personal history of pulmonary embolism: Secondary | ICD-10-CM | POA: Diagnosis not present

## 2017-04-09 DIAGNOSIS — N183 Chronic kidney disease, stage 3 (moderate): Secondary | ICD-10-CM | POA: Diagnosis not present

## 2017-04-09 DIAGNOSIS — Z86711 Personal history of pulmonary embolism: Secondary | ICD-10-CM | POA: Diagnosis not present

## 2017-04-09 DIAGNOSIS — E782 Mixed hyperlipidemia: Secondary | ICD-10-CM | POA: Diagnosis not present

## 2017-04-09 DIAGNOSIS — D509 Iron deficiency anemia, unspecified: Secondary | ICD-10-CM | POA: Diagnosis not present

## 2017-04-09 DIAGNOSIS — Z7901 Long term (current) use of anticoagulants: Secondary | ICD-10-CM | POA: Diagnosis not present

## 2017-04-09 DIAGNOSIS — Z86718 Personal history of other venous thrombosis and embolism: Secondary | ICD-10-CM | POA: Diagnosis not present

## 2017-04-16 DIAGNOSIS — N183 Chronic kidney disease, stage 3 (moderate): Secondary | ICD-10-CM | POA: Diagnosis not present

## 2017-04-16 DIAGNOSIS — R001 Bradycardia, unspecified: Secondary | ICD-10-CM | POA: Diagnosis not present

## 2017-04-16 DIAGNOSIS — D509 Iron deficiency anemia, unspecified: Secondary | ICD-10-CM | POA: Diagnosis not present

## 2017-04-16 DIAGNOSIS — E782 Mixed hyperlipidemia: Secondary | ICD-10-CM | POA: Diagnosis not present

## 2017-05-07 DIAGNOSIS — Z86711 Personal history of pulmonary embolism: Secondary | ICD-10-CM | POA: Diagnosis not present

## 2017-05-07 DIAGNOSIS — Z86718 Personal history of other venous thrombosis and embolism: Secondary | ICD-10-CM | POA: Diagnosis not present

## 2017-05-07 DIAGNOSIS — Z7901 Long term (current) use of anticoagulants: Secondary | ICD-10-CM | POA: Diagnosis not present

## 2017-05-10 ENCOUNTER — Telehealth: Payer: Self-pay | Admitting: Hematology

## 2017-06-06 DIAGNOSIS — Z7901 Long term (current) use of anticoagulants: Secondary | ICD-10-CM | POA: Diagnosis not present

## 2017-06-06 DIAGNOSIS — Z86711 Personal history of pulmonary embolism: Secondary | ICD-10-CM | POA: Diagnosis not present

## 2017-06-11 DIAGNOSIS — M0609 Rheumatoid arthritis without rheumatoid factor, multiple sites: Secondary | ICD-10-CM | POA: Diagnosis not present

## 2017-06-11 DIAGNOSIS — M25569 Pain in unspecified knee: Secondary | ICD-10-CM | POA: Diagnosis not present

## 2017-06-11 DIAGNOSIS — M545 Low back pain: Secondary | ICD-10-CM | POA: Diagnosis not present

## 2017-06-11 DIAGNOSIS — Z79899 Other long term (current) drug therapy: Secondary | ICD-10-CM | POA: Diagnosis not present

## 2017-06-11 DIAGNOSIS — M150 Primary generalized (osteo)arthritis: Secondary | ICD-10-CM | POA: Diagnosis not present

## 2017-06-11 DIAGNOSIS — M5136 Other intervertebral disc degeneration, lumbar region: Secondary | ICD-10-CM | POA: Diagnosis not present

## 2017-06-12 ENCOUNTER — Inpatient Hospital Stay (HOSPITAL_COMMUNITY)
Admission: EM | Admit: 2017-06-12 | Discharge: 2017-06-24 | DRG: 870 | Disposition: E | Payer: Medicare Other | Attending: Critical Care Medicine | Admitting: Critical Care Medicine

## 2017-06-12 ENCOUNTER — Emergency Department (HOSPITAL_COMMUNITY): Payer: Medicare Other

## 2017-06-12 ENCOUNTER — Encounter (HOSPITAL_COMMUNITY): Payer: Self-pay | Admitting: Emergency Medicine

## 2017-06-12 DIAGNOSIS — A419 Sepsis, unspecified organism: Principal | ICD-10-CM

## 2017-06-12 DIAGNOSIS — R0603 Acute respiratory distress: Secondary | ICD-10-CM | POA: Diagnosis not present

## 2017-06-12 DIAGNOSIS — J1000 Influenza due to other identified influenza virus with unspecified type of pneumonia: Secondary | ICD-10-CM | POA: Diagnosis not present

## 2017-06-12 DIAGNOSIS — Z9189 Other specified personal risk factors, not elsewhere classified: Secondary | ICD-10-CM

## 2017-06-12 DIAGNOSIS — N189 Chronic kidney disease, unspecified: Secondary | ICD-10-CM | POA: Diagnosis present

## 2017-06-12 DIAGNOSIS — Z7901 Long term (current) use of anticoagulants: Secondary | ICD-10-CM

## 2017-06-12 DIAGNOSIS — R579 Shock, unspecified: Secondary | ICD-10-CM

## 2017-06-12 DIAGNOSIS — Z7952 Long term (current) use of systemic steroids: Secondary | ICD-10-CM

## 2017-06-12 DIAGNOSIS — J189 Pneumonia, unspecified organism: Secondary | ICD-10-CM | POA: Diagnosis not present

## 2017-06-12 DIAGNOSIS — F319 Bipolar disorder, unspecified: Secondary | ICD-10-CM | POA: Diagnosis present

## 2017-06-12 DIAGNOSIS — J9601 Acute respiratory failure with hypoxia: Secondary | ICD-10-CM | POA: Diagnosis not present

## 2017-06-12 DIAGNOSIS — Z978 Presence of other specified devices: Secondary | ICD-10-CM

## 2017-06-12 DIAGNOSIS — E874 Mixed disorder of acid-base balance: Secondary | ICD-10-CM | POA: Diagnosis present

## 2017-06-12 DIAGNOSIS — G9341 Metabolic encephalopathy: Secondary | ICD-10-CM | POA: Diagnosis not present

## 2017-06-12 DIAGNOSIS — E876 Hypokalemia: Secondary | ICD-10-CM | POA: Diagnosis not present

## 2017-06-12 DIAGNOSIS — R195 Other fecal abnormalities: Secondary | ICD-10-CM | POA: Diagnosis not present

## 2017-06-12 DIAGNOSIS — I4891 Unspecified atrial fibrillation: Secondary | ICD-10-CM | POA: Diagnosis present

## 2017-06-12 DIAGNOSIS — M1990 Unspecified osteoarthritis, unspecified site: Secondary | ICD-10-CM | POA: Diagnosis present

## 2017-06-12 DIAGNOSIS — R0902 Hypoxemia: Secondary | ICD-10-CM

## 2017-06-12 DIAGNOSIS — M069 Rheumatoid arthritis, unspecified: Secondary | ICD-10-CM | POA: Diagnosis present

## 2017-06-12 DIAGNOSIS — D696 Thrombocytopenia, unspecified: Secondary | ICD-10-CM | POA: Diagnosis not present

## 2017-06-12 DIAGNOSIS — Z888 Allergy status to other drugs, medicaments and biological substances status: Secondary | ICD-10-CM

## 2017-06-12 DIAGNOSIS — E2740 Unspecified adrenocortical insufficiency: Secondary | ICD-10-CM | POA: Diagnosis present

## 2017-06-12 DIAGNOSIS — R6521 Severe sepsis with septic shock: Secondary | ICD-10-CM | POA: Diagnosis present

## 2017-06-12 DIAGNOSIS — J80 Acute respiratory distress syndrome: Secondary | ICD-10-CM | POA: Diagnosis not present

## 2017-06-12 DIAGNOSIS — E669 Obesity, unspecified: Secondary | ICD-10-CM | POA: Diagnosis present

## 2017-06-12 DIAGNOSIS — Z87891 Personal history of nicotine dependence: Secondary | ICD-10-CM

## 2017-06-12 DIAGNOSIS — Z886 Allergy status to analgesic agent status: Secondary | ICD-10-CM

## 2017-06-12 DIAGNOSIS — Z4659 Encounter for fitting and adjustment of other gastrointestinal appliance and device: Secondary | ICD-10-CM

## 2017-06-12 DIAGNOSIS — R069 Unspecified abnormalities of breathing: Secondary | ICD-10-CM | POA: Diagnosis not present

## 2017-06-12 DIAGNOSIS — Z66 Do not resuscitate: Secondary | ICD-10-CM | POA: Diagnosis not present

## 2017-06-12 DIAGNOSIS — D500 Iron deficiency anemia secondary to blood loss (chronic): Secondary | ICD-10-CM | POA: Diagnosis present

## 2017-06-12 DIAGNOSIS — R6520 Severe sepsis without septic shock: Secondary | ICD-10-CM | POA: Diagnosis not present

## 2017-06-12 DIAGNOSIS — R34 Anuria and oliguria: Secondary | ICD-10-CM | POA: Diagnosis not present

## 2017-06-12 DIAGNOSIS — Z859 Personal history of malignant neoplasm, unspecified: Secondary | ICD-10-CM

## 2017-06-12 DIAGNOSIS — D72820 Lymphocytosis (symptomatic): Secondary | ICD-10-CM | POA: Diagnosis not present

## 2017-06-12 DIAGNOSIS — Z515 Encounter for palliative care: Secondary | ICD-10-CM | POA: Diagnosis not present

## 2017-06-12 DIAGNOSIS — I21A1 Myocardial infarction type 2: Secondary | ICD-10-CM | POA: Diagnosis not present

## 2017-06-12 DIAGNOSIS — Z6841 Body Mass Index (BMI) 40.0 and over, adult: Secondary | ICD-10-CM

## 2017-06-12 DIAGNOSIS — N170 Acute kidney failure with tubular necrosis: Secondary | ICD-10-CM | POA: Diagnosis present

## 2017-06-12 DIAGNOSIS — J9602 Acute respiratory failure with hypercapnia: Secondary | ICD-10-CM

## 2017-06-12 DIAGNOSIS — Z86711 Personal history of pulmonary embolism: Secondary | ICD-10-CM

## 2017-06-12 DIAGNOSIS — I472 Ventricular tachycardia: Secondary | ICD-10-CM | POA: Diagnosis not present

## 2017-06-12 DIAGNOSIS — Z452 Encounter for adjustment and management of vascular access device: Secondary | ICD-10-CM

## 2017-06-12 LAB — PROTIME-INR
INR: 1.57
Prothrombin Time: 18.7 seconds — ABNORMAL HIGH (ref 11.4–15.2)

## 2017-06-12 LAB — I-STAT ARTERIAL BLOOD GAS, ED
ACID-BASE DEFICIT: 12 mmol/L — AB (ref 0.0–2.0)
Bicarbonate: 15.2 mmol/L — ABNORMAL LOW (ref 20.0–28.0)
O2 SAT: 88 %
PCO2 ART: 40.2 mmHg (ref 32.0–48.0)
PH ART: 7.187 — AB (ref 7.350–7.450)
PO2 ART: 68 mmHg — AB (ref 83.0–108.0)
Patient temperature: 98.7
TCO2: 16 mmol/L — ABNORMAL LOW (ref 22–32)

## 2017-06-12 LAB — I-STAT VENOUS BLOOD GAS, ED
Acid-base deficit: 15 mmol/L — ABNORMAL HIGH (ref 0.0–2.0)
Bicarbonate: 15.5 mmol/L — ABNORMAL LOW (ref 20.0–28.0)
O2 Saturation: 44 %
PH VEN: 7.07 — AB (ref 7.250–7.430)
TCO2: 17 mmol/L — AB (ref 22–32)
pCO2, Ven: 53.4 mmHg (ref 44.0–60.0)
pO2, Ven: 34 mmHg (ref 32.0–45.0)

## 2017-06-12 LAB — COMPREHENSIVE METABOLIC PANEL
ALK PHOS: 49 U/L (ref 38–126)
ALT: 23 U/L (ref 17–63)
AST: 50 U/L — ABNORMAL HIGH (ref 15–41)
Albumin: 3.6 g/dL (ref 3.5–5.0)
Anion gap: 17 — ABNORMAL HIGH (ref 5–15)
BILIRUBIN TOTAL: 0.7 mg/dL (ref 0.3–1.2)
BUN: 20 mg/dL (ref 6–20)
CALCIUM: 8.8 mg/dL — AB (ref 8.9–10.3)
CO2: 15 mmol/L — ABNORMAL LOW (ref 22–32)
Chloride: 104 mmol/L (ref 101–111)
Creatinine, Ser: 2.02 mg/dL — ABNORMAL HIGH (ref 0.61–1.24)
GFR, EST AFRICAN AMERICAN: 33 mL/min — AB (ref >60)
GFR, EST NON AFRICAN AMERICAN: 29 mL/min — AB (ref >60)
Glucose, Bld: 276 mg/dL — ABNORMAL HIGH (ref 65–99)
POTASSIUM: 3.7 mmol/L (ref 3.5–5.1)
Sodium: 136 mmol/L (ref 135–145)
TOTAL PROTEIN: 6.9 g/dL (ref 6.5–8.1)

## 2017-06-12 LAB — I-STAT CHEM 8, ED
BUN: 22 mg/dL — ABNORMAL HIGH (ref 6–20)
CHLORIDE: 107 mmol/L (ref 101–111)
Calcium, Ion: 1.17 mmol/L (ref 1.15–1.40)
Creatinine, Ser: 1.9 mg/dL — ABNORMAL HIGH (ref 0.61–1.24)
Glucose, Bld: 275 mg/dL — ABNORMAL HIGH (ref 65–99)
HCT: 34 % — ABNORMAL LOW (ref 39.0–52.0)
HEMOGLOBIN: 11.6 g/dL — AB (ref 13.0–17.0)
POTASSIUM: 3.9 mmol/L (ref 3.5–5.1)
SODIUM: 139 mmol/L (ref 135–145)
TCO2: 17 mmol/L — ABNORMAL LOW (ref 22–32)

## 2017-06-12 LAB — I-STAT TROPONIN, ED: TROPONIN I, POC: 2.56 ng/mL — AB (ref 0.00–0.08)

## 2017-06-12 LAB — CBC WITH DIFFERENTIAL/PLATELET
BASOS ABS: 0 10*3/uL (ref 0.0–0.1)
BASOS PCT: 0 %
Eosinophils Absolute: 0 10*3/uL (ref 0.0–0.7)
Eosinophils Relative: 0 %
HEMATOCRIT: 33.4 % — AB (ref 39.0–52.0)
Hemoglobin: 10.3 g/dL — ABNORMAL LOW (ref 13.0–17.0)
Lymphocytes Relative: 39 %
Lymphs Abs: 6.9 10*3/uL — ABNORMAL HIGH (ref 0.7–4.0)
MCH: 30.9 pg (ref 26.0–34.0)
MCHC: 30.8 g/dL (ref 30.0–36.0)
MCV: 100.3 fL — ABNORMAL HIGH (ref 78.0–100.0)
MONOS PCT: 4 %
Monocytes Absolute: 0.7 10*3/uL (ref 0.1–1.0)
NEUTROS ABS: 10.2 10*3/uL — AB (ref 1.7–7.7)
Neutrophils Relative %: 57 %
Platelets: 230 10*3/uL (ref 150–400)
RBC: 3.33 MIL/uL — ABNORMAL LOW (ref 4.22–5.81)
RDW: 14.4 % (ref 11.5–15.5)
WBC: 17.8 10*3/uL — ABNORMAL HIGH (ref 4.0–10.5)

## 2017-06-12 LAB — MAGNESIUM: Magnesium: 2.1 mg/dL (ref 1.7–2.4)

## 2017-06-12 LAB — I-STAT CG4 LACTIC ACID, ED: LACTIC ACID, VENOUS: 8.94 mmol/L — AB (ref 0.5–1.9)

## 2017-06-12 MED ORDER — VANCOMYCIN HCL 10 G IV SOLR
1750.0000 mg | Freq: Once | INTRAVENOUS | Status: AC
  Administered 2017-06-12: 1750 mg via INTRAVENOUS
  Filled 2017-06-12: qty 1750

## 2017-06-12 MED ORDER — FUROSEMIDE 10 MG/ML IJ SOLN: 40.0000 mg | INTRAMUSCULAR | Status: DC

## 2017-06-12 MED ORDER — NITROGLYCERIN IN D5W 200-5 MCG/ML-% IV SOLN
0.0000 ug/min | Freq: Once | INTRAVENOUS | Status: AC
  Administered 2017-06-12: 5 ug/min via INTRAVENOUS
  Filled 2017-06-12: qty 250

## 2017-06-12 MED ORDER — SODIUM CHLORIDE 0.9 % IV BOLUS (SEPSIS)
1000.0000 mL | Freq: Once | INTRAVENOUS | Status: AC
  Administered 2017-06-12: 1000 mL via INTRAVENOUS

## 2017-06-12 MED ORDER — VANCOMYCIN HCL IN DEXTROSE 1-5 GM/200ML-% IV SOLN: 1000.0000 mg | Freq: Once | INTRAVENOUS | Status: DC

## 2017-06-12 MED ORDER — PIPERACILLIN-TAZOBACTAM 3.375 G IVPB 30 MIN
3.3750 g | Freq: Once | INTRAVENOUS | Status: AC
  Administered 2017-06-12: 3.375 g via INTRAVENOUS
  Filled 2017-06-12: qty 50

## 2017-06-12 MED ORDER — SODIUM CHLORIDE 0.9 % IV BOLUS (SEPSIS)
1000.0000 mL | Freq: Once | INTRAVENOUS | Status: AC
  Administered 2017-06-13: 1000 mL via INTRAVENOUS

## 2017-06-12 NOTE — ED Notes (Signed)
ED Provider at bedside. 

## 2017-06-12 NOTE — ED Notes (Signed)
X-ray at bedside

## 2017-06-12 NOTE — ED Triage Notes (Addendum)
Per EMS, pt from home c/o sob with a cough this am. Pt has no known hx of COPD or CHF. Pt was found in the 60% on room air. Per ems, wheezing in upper lungs and fluid in lungs. Pt placed on CPAP and and received 5 of Albuterol. Pt's sats went up into the 90s for a short period than back into the 80s on CPAP. Pt started taking Tramadol and Prednisone today as well. Pt on Coumadin.   EMS VS BP 170/90, 80% SpO2 on cpap, HR 120 with hx of Afib.

## 2017-06-12 NOTE — ED Provider Notes (Signed)
[facility] EMERGENCY DEPARTMENT Provider Note   CSN: [id number] Arrival date & time: 06/21/2017  2236     History   Chief Complaint Chief Complaint  Patient presents with  . Respiratory Distress    LEVEL 5 CAVEAT 2/2 ACUITY OF CONDITION  HPI Miguel [name] is a 84 y.o. male.  84 year old male with a history of cancer, clotting disorder, depression presents to the emergency department for shortness of breath.  Patient reportedly began with a cough this morning and progressed to worsening shortness of breath throughout the day.  No known history of COPD or CHF, though triage note reports history of atrial fibrillation.  Patient has no complaints of chest pain.  He is unsure of any recent fevers.  He states that he is supposed to take prednisone daily, but he has not taken this in a while.  As a result he took 5 mg of prednisone this morning.  EMS was called and found the patient to have oxygen saturations in the 60s on room air.  He was placed on BiPAP and given 5 mg of albuterol with only mild improvement to his symptoms.  Saturations did improve to the mid 80s in transport.  Patient reports compliance with his daily Coumadin.      Past Medical History:  Diagnosis Date  . Arthritis   . Cancer ([place])   . Clotting disorder ([place])   . Depression     Patient Active Problem List   Diagnosis Date Noted  . Iron deficiency anemia due to chronic blood loss 08/29/2016  . BRADYCARDIA 07/19/2008  . DIZZINESS 07/19/2008  . FATIGUE 07/19/2008  . Shortness of breath 07/19/2008  . DEPRESSION 07/15/2008  . PULMONARY EMBOLISM 07/15/2008  . DVT 07/15/2008  . ARTHRITIS, RHEUMATOID 07/15/2008    Past Surgical History:  Procedure Laterality Date  . PROSTATE SURGERY    . SPINE SURGERY         Home Medications    Prior to Admission medications   Medication Sig Start Date End Date Taking? Authorizing Provider  ALPRAZolam Duanne Moron) 0.25 MG tablet Take 0.25 mg by  mouth at bedtime as needed for sleep.    [provider]  ARIPiprazole (ABILIFY) 2 MG tablet Take 2.5 mg by mouth every other day.     [provider]  Cholecalciferol (VITAMIN D3) 2000 UNITS TABS Take by mouth daily.    [provider]  lithium carbonate 150 MG capsule Take 225 mg by mouth daily.  09/22/12   [provider]  Multiple Vitamin (MULTIVITAMIN) tablet Take 1 tablet by mouth daily.    [provider]  predniSONE (DELTASONE) 2.5 MG tablet TAKE 1 TABLET BY MOUTH WITH FOOD OR MILK EVERY DAY 02/29/16   [provider]  sertraline (ZOLOFT) 100 MG tablet Take 100 mg by mouth daily. 10/13/12   [provider]  Tofacitinib Citrate (XELJANZ) 5 MG TABS Take by mouth daily. Pt unsure of dose.    [provider]  traMADol (ULTRAM) 50 MG tablet Take 50 mg by mouth. 09/28/15   [provider]  warfarin (COUMADIN) 5 MG tablet Take by mouth. 5 MG daily except 2.5 MG on Mondays and Fridays.    [provider]    Family History No family history on file.  Social History Social History   Tobacco Use  . Smoking status: Former Research scientist (life sciences)  . Smokeless tobacco: Former Systems developer    Quit date: 03/25/1957  Substance Use Topics  . Alcohol use:  No  . Drug use: No     Allergies   Ibuprofen; Oxaprozin; and Phenelzine sulfate   Review of Systems Review of Systems  Unable to perform ROS: Acuity of condition    Physical Exam Updated Vital Signs BP 124/65   Pulse 94   Temp 98.7 F (37.1 C) (Axillary)   Resp (!) 38   Ht 5\' 7"  (1.702 m)   Wt 90.7 kg (200 lb)   SpO2 93%   BMI 31.32 kg/m   Physical Exam  Constitutional: He is oriented to person, place, and time. He appears well-developed and well-nourished. He appears distressed.  Obese male in moderate distress  HENT:  Head: Normocephalic and atraumatic.  Eyes: Conjunctivae and EOM are normal. No scleral icterus.  Neck: Normal range of motion.  Cardiovascular:  Regular rhythm and intact distal pulses. Tachycardia present.  Pulmonary/Chest: Accessory muscle usage present. Tachypnea noted. He is in respiratory distress.  SpO2 82% on BiPAP which improved to 90-92% on BiPAP while in the exam room. Bilateral rhonchi. No wheezing noted.  Abdominal: He exhibits distension.  Obese, distended, soft abdomen.  Musculoskeletal: Normal range of motion.  Trace to 1+ pitting edema in the LLE. No significant edema in the RLE.  Neurological: He is alert and oriented to person, place, and time. He exhibits normal muscle tone.  GCS 15. Speech is clear. Answers questions appropriately. Moving all extremities spontaneously.  Skin: Skin is warm and dry. No rash noted. No erythema. No pallor.  Psychiatric: He has a normal mood and affect. His behavior is normal.  Nursing note and vitals reviewed.    ED Treatments / Results  Labs (all labs ordered are listed, but only abnormal results are displayed) Labs Reviewed  CBC WITH DIFFERENTIAL/PLATELET - Abnormal; Notable for the following components:      Result Value   WBC 17.8 (*)    RBC 3.33 (*)    Hemoglobin 10.3 (*)    HCT 33.4 (*)    MCV 100.3 (*)    Neutro Abs 10.2 (*)    Lymphs Abs 6.9 (*)    All other components within normal limits  COMPREHENSIVE METABOLIC PANEL - Abnormal; Notable for the following components:   CO2 15 (*)    Glucose, Bld 276 (*)    Creatinine, Ser 2.02 (*)    Calcium 8.8 (*)    AST 50 (*)    GFR calc non Af Amer 29 (*)    GFR calc Af Amer 33 (*)    Anion gap 17 (*)    All other components within normal limits  BRAIN NATRIURETIC PEPTIDE - Abnormal; Notable for the following components:   B Natriuretic Peptide 734.9 (*)    All other components within normal limits  PROTIME-INR - Abnormal; Notable for the following components:   Prothrombin Time 18.7 (*)    All other components within normal limits  LACTIC ACID, PLASMA - Abnormal; Notable for the following components:   Lactic Acid,  Venous 4.9 (*)    All other components within normal limits  INFLUENZA PANEL BY PCR (TYPE A & B) - Abnormal; Notable for the following components:   Influenza A By PCR POSITIVE (*)    All other components within normal limits  I-STAT CHEM 8, ED - Abnormal; Notable for the following components:   BUN 22 (*)    Creatinine, Ser 1.90 (*)    Glucose, Bld 275 (*)    TCO2 17 (*)    Hemoglobin 11.6 (*)  HCT 34.0 (*)    All other components within normal limits  I-STAT TROPONIN, ED - Abnormal; Notable for the following components:   Troponin i, poc 2.56 (*)    All other components within normal limits  I-STAT ARTERIAL BLOOD GAS, ED - Abnormal; Notable for the following components:   pH, Arterial 7.187 (*)    pO2, Arterial 68.0 (*)    Bicarbonate 15.2 (*)    TCO2 16 (*)    Acid-base deficit 12.0 (*)    All other components within normal limits  I-STAT CG4 LACTIC ACID, ED - Abnormal; Notable for the following components:   Lactic Acid, Venous 8.94 (*)    All other components within normal limits  I-STAT VENOUS BLOOD GAS, ED - Abnormal; Notable for the following components:   pH, Ven 7.070 (*)    Bicarbonate 15.5 (*)    TCO2 17 (*)    Acid-base deficit 15.0 (*)    All other components within normal limits  I-STAT ARTERIAL BLOOD GAS, ED - Abnormal; Notable for the following components:   pH, Arterial 7.256 (*)    Bicarbonate 15.8 (*)    TCO2 17 (*)    Acid-base deficit 10.0 (*)    All other components within normal limits  RESPIRATORY PANEL BY PCR  CULTURE, BLOOD (ROUTINE X 2)  CULTURE, BLOOD (ROUTINE X 2)  MAGNESIUM  BLOOD GAS, VENOUS  BLOOD GAS, ARTERIAL  TROPONIN I  PROCALCITONIN    EKG  EKG Interpretation  Date/Time:  Wednesday June 12 2017 22:41:10 EDT Ventricular Rate:  117 PR Interval:    QRS Duration: 141 QT Interval:  337 QTC Calculation: 471 R Axis:   -39 Text Interpretation:  Sinus tachycardia LVH with IVCD, LAD and secondary repol abnrm sltr qrs widening  Otherwise no significant change Confirmed by Deno Etienne [phone]) on 06/14/2017 10:49:09 PM       Radiology Dg Chest Portable 1 View  Result Date: 06/09/2017 CLINICAL DATA:  Respiratory distress EXAM: PORTABLE CHEST 1 VIEW COMPARISON:  01/09/2009 FINDINGS: Mild cardiomegaly with vascular congestion. Bibasilar airspace disease. No pleural effusion. No pneumothorax. IMPRESSION: 1. Bibasilar airspace disease concerning for pneumonia 2. Cardiomegaly with mild vascular congestion Electronically Signed   By: Donavan Foil M.D.   On: 05/28/2017 23:34    Procedures Procedures (including critical care time)  Medications Ordered in ED Medications  vancomycin (VANCOCIN) 1,750 mg in sodium chloride 0.9 % 500 mL IVPB (1,750 mg Intravenous New Bag/Given 06/02/2017 2342)  sodium chloride 0.9 % bolus 1,000 mL (not administered)  nitroGLYCERIN 50 mg in dextrose 5 % 250 mL (0.2 mg/mL) infusion (0 mcg/min Intravenous Stopped 06/20/2017 2312)  piperacillin-tazobactam (ZOSYN) IVPB 3.375 g (0 g Intravenous Stopped 06/13/17 0008)  sodium chloride 0.9 % bolus 1,000 mL (1,000 mLs Intravenous New Bag/Given 06/15/2017 2310)  sodium chloride 0.9 % bolus 1,000 mL (1,000 mLs Intravenous New Bag/Given 06/13/17 0007)    CRITICAL CARE Performed by: Antonietta Breach   Total critical care time: 45 minutes  Critical care time was exclusive of separately billable procedures and treating other patients.  Critical care was necessary to treat or prevent imminent or life-threatening deterioration.  Critical care was time spent personally by me on the following activities: development of treatment plan with patient and/or surrogate as well as nursing, discussions with consultants, evaluation of patient's response to treatment, examination of patient, obtaining history from patient or surrogate, ordering and performing treatments and interventions, ordering and review of laboratory studies, ordering and review of radiographic studies,  pulse  oximetry and re-evaluation of patient's condition.   Initial Impression / Assessment and Plan / ED Course  I have reviewed the triage vital signs and the nursing notes.  Pertinent labs & imaging results that were available during my care of the patient were reviewed by me and considered in my medical decision making (see chart for details).      80:34 PM 84 year old male presents to the emergency department for shortness of breath.  Sats initially in the 60s on EMS arrival.  Patient has had improvement in saturations to the low 90s since ED arrival, maintaining on BiPAP.  Tolerating well.  Denies chest pain.  Does report cough with onset this morning.  Rhonchorous breath sounds noted with mild lower extremity pitting edema.  11:15 PM Patient reporting fever of 101F yesterday.  Given concern for possible pneumonia on visualization of imaging, nitroglycerin stopped.  Will consider restarting if CXR more suggestive of fluid overload.  Patient mentating well despite degree of acidosis.  Troponin elevated, but suspected 2/2 demand and prolonged hypoxia.  11:47 PM Chest x-ray concerning for pneumonia with associated cardiomegaly and mild vascular congestion.  Heart rate improving.  Saturations remained stable at 93%.  12:24 AM Patient continues to tolerate BiPAP well. Labs with leukocytosis, mildly elevated BNP. Will consult PCCM for admission. 3rd liter of fluids ordered as well as repeat ABG.  12:36 AM Case discussed with PCCM who will see. Patient is FULL CODE; this discussion was had between the patient and my attending, Dr. Leonides Schanz.   Final Clinical Impressions(s) / ED Diagnoses   Final diagnoses:  Sepsis, due to unspecified organism [facility])  Community acquired pneumonia, unspecified laterality  Acute respiratory failure with hypoxia [facility])    ED Discharge Orders    None       Antonietta Breach, PA-C 06/13/17 0309

## 2017-06-13 ENCOUNTER — Inpatient Hospital Stay (HOSPITAL_COMMUNITY): Payer: Medicare Other

## 2017-06-13 DIAGNOSIS — N183 Chronic kidney disease, stage 3 (moderate): Secondary | ICD-10-CM

## 2017-06-13 DIAGNOSIS — N179 Acute kidney failure, unspecified: Secondary | ICD-10-CM | POA: Diagnosis not present

## 2017-06-13 DIAGNOSIS — I214 Non-ST elevation (NSTEMI) myocardial infarction: Secondary | ICD-10-CM

## 2017-06-13 DIAGNOSIS — I21A1 Myocardial infarction type 2: Secondary | ICD-10-CM | POA: Diagnosis not present

## 2017-06-13 DIAGNOSIS — G9340 Encephalopathy, unspecified: Secondary | ICD-10-CM | POA: Diagnosis not present

## 2017-06-13 DIAGNOSIS — R6521 Severe sepsis with septic shock: Secondary | ICD-10-CM | POA: Diagnosis not present

## 2017-06-13 DIAGNOSIS — J80 Acute respiratory distress syndrome: Secondary | ICD-10-CM | POA: Diagnosis not present

## 2017-06-13 DIAGNOSIS — Z888 Allergy status to other drugs, medicaments and biological substances status: Secondary | ICD-10-CM | POA: Diagnosis not present

## 2017-06-13 DIAGNOSIS — I472 Ventricular tachycardia: Secondary | ICD-10-CM | POA: Diagnosis not present

## 2017-06-13 DIAGNOSIS — J101 Influenza due to other identified influenza virus with other respiratory manifestations: Secondary | ICD-10-CM | POA: Diagnosis not present

## 2017-06-13 DIAGNOSIS — J9600 Acute respiratory failure, unspecified whether with hypoxia or hypercapnia: Secondary | ICD-10-CM | POA: Diagnosis not present

## 2017-06-13 DIAGNOSIS — N189 Chronic kidney disease, unspecified: Secondary | ICD-10-CM | POA: Diagnosis present

## 2017-06-13 DIAGNOSIS — J1000 Influenza due to other identified influenza virus with unspecified type of pneumonia: Secondary | ICD-10-CM | POA: Diagnosis present

## 2017-06-13 DIAGNOSIS — R34 Anuria and oliguria: Secondary | ICD-10-CM | POA: Diagnosis not present

## 2017-06-13 DIAGNOSIS — F319 Bipolar disorder, unspecified: Secondary | ICD-10-CM | POA: Diagnosis present

## 2017-06-13 DIAGNOSIS — A419 Sepsis, unspecified organism: Principal | ICD-10-CM

## 2017-06-13 DIAGNOSIS — R579 Shock, unspecified: Secondary | ICD-10-CM

## 2017-06-13 DIAGNOSIS — E874 Mixed disorder of acid-base balance: Secondary | ICD-10-CM | POA: Diagnosis present

## 2017-06-13 DIAGNOSIS — E2740 Unspecified adrenocortical insufficiency: Secondary | ICD-10-CM | POA: Diagnosis present

## 2017-06-13 DIAGNOSIS — Z86711 Personal history of pulmonary embolism: Secondary | ICD-10-CM | POA: Diagnosis not present

## 2017-06-13 DIAGNOSIS — M069 Rheumatoid arthritis, unspecified: Secondary | ICD-10-CM | POA: Diagnosis present

## 2017-06-13 DIAGNOSIS — M1990 Unspecified osteoarthritis, unspecified site: Secondary | ICD-10-CM | POA: Diagnosis present

## 2017-06-13 DIAGNOSIS — Z452 Encounter for adjustment and management of vascular access device: Secondary | ICD-10-CM | POA: Diagnosis not present

## 2017-06-13 DIAGNOSIS — G9341 Metabolic encephalopathy: Secondary | ICD-10-CM | POA: Diagnosis not present

## 2017-06-13 DIAGNOSIS — Z87891 Personal history of nicotine dependence: Secondary | ICD-10-CM | POA: Diagnosis not present

## 2017-06-13 DIAGNOSIS — R918 Other nonspecific abnormal finding of lung field: Secondary | ICD-10-CM | POA: Diagnosis not present

## 2017-06-13 DIAGNOSIS — D500 Iron deficiency anemia secondary to blood loss (chronic): Secondary | ICD-10-CM | POA: Diagnosis present

## 2017-06-13 DIAGNOSIS — Z7901 Long term (current) use of anticoagulants: Secondary | ICD-10-CM | POA: Diagnosis not present

## 2017-06-13 DIAGNOSIS — J1100 Influenza due to unidentified influenza virus with unspecified type of pneumonia: Secondary | ICD-10-CM | POA: Diagnosis not present

## 2017-06-13 DIAGNOSIS — J9601 Acute respiratory failure with hypoxia: Secondary | ICD-10-CM | POA: Diagnosis not present

## 2017-06-13 DIAGNOSIS — Z886 Allergy status to analgesic agent status: Secondary | ICD-10-CM | POA: Diagnosis not present

## 2017-06-13 DIAGNOSIS — Z7952 Long term (current) use of systemic steroids: Secondary | ICD-10-CM | POA: Diagnosis not present

## 2017-06-13 DIAGNOSIS — Z6841 Body Mass Index (BMI) 40.0 and over, adult: Secondary | ICD-10-CM | POA: Diagnosis not present

## 2017-06-13 DIAGNOSIS — J849 Interstitial pulmonary disease, unspecified: Secondary | ICD-10-CM | POA: Diagnosis not present

## 2017-06-13 DIAGNOSIS — Z4682 Encounter for fitting and adjustment of non-vascular catheter: Secondary | ICD-10-CM | POA: Diagnosis not present

## 2017-06-13 DIAGNOSIS — I4891 Unspecified atrial fibrillation: Secondary | ICD-10-CM | POA: Diagnosis present

## 2017-06-13 DIAGNOSIS — N170 Acute kidney failure with tubular necrosis: Secondary | ICD-10-CM | POA: Diagnosis present

## 2017-06-13 LAB — BLOOD GAS, ARTERIAL
Acid-base deficit: 7.2 mmol/L — ABNORMAL HIGH (ref 0.0–2.0)
BICARBONATE: 18.9 mmol/L — AB (ref 20.0–28.0)
Drawn by: 398991
FIO2: 100
O2 Saturation: 84.2 %
PATIENT TEMPERATURE: 103.2
PEEP: 5 cmH2O
PO2 ART: 63.6 mmHg — AB (ref 83.0–108.0)
RATE: 20 resp/min
VT: 530 mL
pCO2 arterial: 51.6 mmHg — ABNORMAL HIGH (ref 32.0–48.0)
pH, Arterial: 7.206 — ABNORMAL LOW (ref 7.350–7.450)

## 2017-06-13 LAB — RESPIRATORY PANEL BY PCR
Adenovirus: NOT DETECTED
BORDETELLA PERTUSSIS-RVPCR: NOT DETECTED
CORONAVIRUS 229E-RVPPCR: NOT DETECTED
CORONAVIRUS HKU1-RVPPCR: NOT DETECTED
CORONAVIRUS NL63-RVPPCR: NOT DETECTED
CORONAVIRUS OC43-RVPPCR: NOT DETECTED
Chlamydophila pneumoniae: NOT DETECTED
Influenza A H3: DETECTED — AB
Influenza B: NOT DETECTED
METAPNEUMOVIRUS-RVPPCR: NOT DETECTED
Mycoplasma pneumoniae: NOT DETECTED
PARAINFLUENZA VIRUS 1-RVPPCR: NOT DETECTED
PARAINFLUENZA VIRUS 2-RVPPCR: NOT DETECTED
PARAINFLUENZA VIRUS 3-RVPPCR: NOT DETECTED
Parainfluenza Virus 4: NOT DETECTED
RHINOVIRUS / ENTEROVIRUS - RVPPCR: NOT DETECTED
Respiratory Syncytial Virus: NOT DETECTED

## 2017-06-13 LAB — POCT I-STAT 3, ART BLOOD GAS (G3+)
ACID-BASE DEFICIT: 8 mmol/L — AB (ref 0.0–2.0)
ACID-BASE DEFICIT: 8 mmol/L — AB (ref 0.0–2.0)
Acid-base deficit: 9 mmol/L — ABNORMAL HIGH (ref 0.0–2.0)
Acid-base deficit: 7 mmol/L — ABNORMAL HIGH (ref 0.0–2.0)
BICARBONATE: 17.4 mmol/L — AB (ref 20.0–28.0)
Bicarbonate: 17.5 mmol/L — ABNORMAL LOW (ref 20.0–28.0)
Bicarbonate: 18.6 mmol/L — ABNORMAL LOW (ref 20.0–28.0)
Bicarbonate: 17.4 mmol/L — ABNORMAL LOW (ref 20.0–28.0)
O2 SAT: 98 %
O2 Saturation: 92 %
O2 Saturation: 77 %
O2 Saturation: 84 %
PCO2 ART: 42 mmHg (ref 32.0–48.0)
PCO2 ART: 36.8 mmHg (ref 32.0–48.0)
PCO2 ART: 39.6 mmHg (ref 32.0–48.0)
PH ART: 7.29 — AB (ref 7.350–7.450)
PH ART: 7.276 — AB (ref 7.350–7.450)
PH ART: 7.362 (ref 7.350–7.450)
PO2 ART: 48 mmHg — AB (ref 83.0–108.0)
Patient temperature: 101.2
Patient temperature: 37.9
Patient temperature: 97.7
Patient temperature: 97.9
TCO2: 19 mmol/L — AB (ref 22–32)
TCO2: 19 mmol/L — ABNORMAL LOW (ref 22–32)
TCO2: 20 mmol/L — ABNORMAL LOW (ref 22–32)
TCO2: 18 mmol/L — ABNORMAL LOW (ref 22–32)
pCO2 arterial: 30.6 mmHg — ABNORMAL LOW (ref 32.0–48.0)
pH, Arterial: 7.233 — ABNORMAL LOW (ref 7.350–7.450)
pO2, Arterial: 79 mmHg — ABNORMAL LOW (ref 83.0–108.0)
pO2, Arterial: 53 mmHg — ABNORMAL LOW (ref 83.0–108.0)
pO2, Arterial: 114 mmHg — ABNORMAL HIGH (ref 83.0–108.0)

## 2017-06-13 LAB — I-STAT ARTERIAL BLOOD GAS, ED
ACID-BASE DEFICIT: 10 mmol/L — AB (ref 0.0–2.0)
Acid-base deficit: 12 mmol/L — ABNORMAL HIGH (ref 0.0–2.0)
BICARBONATE: 15.5 mmol/L — AB (ref 20.0–28.0)
BICARBONATE: 15.8 mmol/L — AB (ref 20.0–28.0)
O2 SAT: 82 %
O2 Saturation: 95 %
PCO2 ART: 41.7 mmHg (ref 32.0–48.0)
PO2 ART: 57 mmHg — AB (ref 83.0–108.0)
PO2 ART: 89 mmHg (ref 83.0–108.0)
Patient temperature: 98.6
Patient temperature: 98.6
TCO2: 17 mmol/L — AB (ref 22–32)
TCO2: 17 mmol/L — ABNORMAL LOW (ref 22–32)
pCO2 arterial: 35.5 mmHg (ref 32.0–48.0)
pH, Arterial: 7.179 — CL (ref 7.350–7.450)
pH, Arterial: 7.256 — ABNORMAL LOW (ref 7.350–7.450)

## 2017-06-13 LAB — TROPONIN I
Troponin I: 8.69 ng/mL (ref <0.03)
Troponin I: 16.58 ng/mL (ref <0.03)
Troponin I: 12.75 ng/mL (ref <0.03)

## 2017-06-13 LAB — BASIC METABOLIC PANEL
Anion gap: 10 (ref 5–15)
BUN: 22 mg/dL — ABNORMAL HIGH (ref 6–20)
CHLORIDE: 111 mmol/L (ref 101–111)
CO2: 19 mmol/L — ABNORMAL LOW (ref 22–32)
Calcium: 7.2 mg/dL — ABNORMAL LOW (ref 8.9–10.3)
Creatinine, Ser: 1.85 mg/dL — ABNORMAL HIGH (ref 0.61–1.24)
GFR calc Af Amer: 37 mL/min — ABNORMAL LOW (ref >60)
GFR calc non Af Amer: 32 mL/min — ABNORMAL LOW (ref >60)
GLUCOSE: 178 mg/dL — AB (ref 65–99)
POTASSIUM: 3.7 mmol/L (ref 3.5–5.1)
Sodium: 140 mmol/L (ref 135–145)

## 2017-06-13 LAB — ECHOCARDIOGRAM COMPLETE
HEIGHTINCHES: 67 in
WEIGHTICAEL: 3280.44 [oz_av]

## 2017-06-13 LAB — PHOSPHORUS: Phosphorus: 2.1 mg/dL — ABNORMAL LOW (ref 2.5–4.6)

## 2017-06-13 LAB — LACTIC ACID, PLASMA
LACTIC ACID, VENOUS: 4.4 mmol/L — AB (ref 0.5–1.9)
LACTIC ACID, VENOUS: 4.9 mmol/L — AB (ref 0.5–1.9)
LACTIC ACID, VENOUS: 4.9 mmol/L — AB (ref 0.5–1.9)
Lactic Acid, Venous: 4.3 mmol/L (ref 0.5–1.9)

## 2017-06-13 LAB — PROCALCITONIN: PROCALCITONIN: 7.09 ng/mL

## 2017-06-13 LAB — HEPARIN LEVEL (UNFRACTIONATED): Heparin Unfractionated: 0.47 IU/mL (ref 0.30–0.70)

## 2017-06-13 LAB — GLUCOSE, CAPILLARY
GLUCOSE-CAPILLARY: 134 mg/dL — AB (ref 65–99)
GLUCOSE-CAPILLARY: 145 mg/dL — AB (ref 65–99)
GLUCOSE-CAPILLARY: 145 mg/dL — AB (ref 65–99)
Glucose-Capillary: 123 mg/dL — ABNORMAL HIGH (ref 65–99)

## 2017-06-13 LAB — PATHOLOGIST SMEAR REVIEW

## 2017-06-13 LAB — MAGNESIUM: MAGNESIUM: 1.5 mg/dL — AB (ref 1.7–2.4)

## 2017-06-13 LAB — INFLUENZA PANEL BY PCR (TYPE A & B)
Influenza A By PCR: POSITIVE — AB
Influenza B By PCR: NEGATIVE

## 2017-06-13 LAB — BRAIN NATRIURETIC PEPTIDE: B NATRIURETIC PEPTIDE 5: 734.9 pg/mL — AB (ref 0.0–100.0)

## 2017-06-13 LAB — MRSA PCR SCREENING: MRSA by PCR: NEGATIVE

## 2017-06-13 MED ORDER — INSULIN ASPART 100 UNIT/ML ~~LOC~~ SOLN
2.0000 [IU] | SUBCUTANEOUS | Status: DC
  Administered 2017-06-13 – 2017-06-14 (×6): 2 [IU] via SUBCUTANEOUS
  Administered 2017-06-15 (×4): 4 [IU] via SUBCUTANEOUS
  Administered 2017-06-15: 2 [IU] via SUBCUTANEOUS
  Administered 2017-06-15: 4 [IU] via SUBCUTANEOUS
  Administered 2017-06-15: 2 [IU] via SUBCUTANEOUS
  Administered 2017-06-16: 4 [IU] via SUBCUTANEOUS
  Administered 2017-06-16 – 2017-06-17 (×6): 2 [IU] via SUBCUTANEOUS

## 2017-06-13 MED ORDER — ACETAMINOPHEN 325 MG PO TABS
650.0000 mg | ORAL_TABLET | ORAL | Status: DC | PRN
  Administered 2017-06-13 – 2017-06-14 (×3): 650 mg
  Filled 2017-06-13 (×4): qty 2

## 2017-06-13 MED ORDER — SODIUM CHLORIDE 0.9 % IV BOLUS (SEPSIS)
1000.0000 mL | Freq: Once | INTRAVENOUS | Status: AC
  Administered 2017-06-13: 1000 mL via INTRAVENOUS

## 2017-06-13 MED ORDER — STERILE WATER FOR INJECTION IV SOLN
INTRAVENOUS | Status: DC
  Administered 2017-06-13 – 2017-06-15 (×8): via INTRAVENOUS
  Filled 2017-06-13 (×10): qty 850

## 2017-06-13 MED ORDER — MIDAZOLAM HCL 2 MG/2ML IJ SOLN: 1.0000 mg | INTRAMUSCULAR | Status: DC | PRN

## 2017-06-13 MED ORDER — ORAL CARE MOUTH RINSE
15.0000 mL | Freq: Four times a day (QID) | OROMUCOSAL | Status: DC
  Administered 2017-06-13 – 2017-06-15 (×9): 15 mL via OROMUCOSAL

## 2017-06-13 MED ORDER — HYDROCORTISONE NA SUCCINATE PF 100 MG IJ SOLR
50.0000 mg | Freq: Four times a day (QID) | INTRAMUSCULAR | Status: DC
  Administered 2017-06-13 – 2017-06-17 (×18): 50 mg via INTRAVENOUS
  Filled 2017-06-13 (×18): qty 2

## 2017-06-13 MED ORDER — DEXTROSE 5 % IV SOLN
0.0000 ug/min | INTRAVENOUS | Status: DC
  Administered 2017-06-13: 18 ug/min via INTRAVENOUS
  Administered 2017-06-13 (×3): 40 ug/min via INTRAVENOUS
  Administered 2017-06-13: 30 ug/min via INTRAVENOUS
  Administered 2017-06-13: 36 ug/min via INTRAVENOUS
  Administered 2017-06-13: 40 ug/min via INTRAVENOUS
  Administered 2017-06-13: 35 ug/min via INTRAVENOUS
  Administered 2017-06-13: 5 ug/min via INTRAVENOUS
  Administered 2017-06-13: 40 ug/min via INTRAVENOUS
  Administered 2017-06-14: 24 ug/min via INTRAVENOUS
  Administered 2017-06-14: 32 ug/min via INTRAVENOUS
  Administered 2017-06-14: 26 ug/min via INTRAVENOUS
  Administered 2017-06-14: 32 ug/min via INTRAVENOUS
  Administered 2017-06-14: 24 ug/min via INTRAVENOUS
  Administered 2017-06-14: 26 ug/min via INTRAVENOUS
  Administered 2017-06-14: 30 ug/min via INTRAVENOUS
  Administered 2017-06-14: 28 ug/min via INTRAVENOUS
  Administered 2017-06-14: 30 ug/min via INTRAVENOUS
  Administered 2017-06-15: 25 ug/min via INTRAVENOUS
  Administered 2017-06-15: 21 ug/min via INTRAVENOUS
  Administered 2017-06-15: 25 ug/min via INTRAVENOUS
  Administered 2017-06-15: 27 ug/min via INTRAVENOUS
  Administered 2017-06-15: 23 ug/min via INTRAVENOUS
  Filled 2017-06-13 (×23): qty 4

## 2017-06-13 MED ORDER — VANCOMYCIN HCL IN DEXTROSE 1-5 GM/200ML-% IV SOLN
1000.0000 mg | INTRAVENOUS | Status: DC
  Administered 2017-06-13: 1000 mg via INTRAVENOUS
  Filled 2017-06-13: qty 200

## 2017-06-13 MED ORDER — SODIUM BICARBONATE 8.4 % IV SOLN
100.0000 meq | Freq: Once | INTRAVENOUS | Status: AC
  Administered 2017-06-13: 100 meq via INTRAVENOUS

## 2017-06-13 MED ORDER — FENTANYL BOLUS VIA INFUSION
25.0000 ug | INTRAVENOUS | Status: DC | PRN
  Filled 2017-06-13: qty 25

## 2017-06-13 MED ORDER — VASOPRESSIN 20 UNIT/ML IV SOLN
0.0400 [IU]/min | INTRAVENOUS | Status: DC
  Administered 2017-06-13 – 2017-06-15 (×5): 0.04 [IU]/min via INTRAVENOUS
  Filled 2017-06-13 (×4): qty 2

## 2017-06-13 MED ORDER — SODIUM CHLORIDE 0.9% FLUSH
10.0000 mL | Freq: Two times a day (BID) | INTRAVENOUS | Status: DC
  Administered 2017-06-13 – 2017-06-17 (×9): 10 mL

## 2017-06-13 MED ORDER — ETOMIDATE 2 MG/ML IV SOLN
30.0000 mg | Freq: Once | INTRAVENOUS | Status: AC
  Administered 2017-06-13: 30 mg via INTRAVENOUS

## 2017-06-13 MED ORDER — SODIUM CHLORIDE 0.9% FLUSH: 10.0000 mL | INTRAVENOUS | Status: DC | PRN

## 2017-06-13 MED ORDER — DOCUSATE SODIUM 50 MG/5ML PO LIQD: 100.0000 mg | Freq: Two times a day (BID) | ORAL | Status: DC | PRN

## 2017-06-13 MED ORDER — CHLORHEXIDINE GLUCONATE CLOTH 2 % EX PADS
6.0000 | MEDICATED_PAD | Freq: Every day | CUTANEOUS | Status: DC
  Administered 2017-06-13 – 2017-06-17 (×3): 6 via TOPICAL

## 2017-06-13 MED ORDER — SUCCINYLCHOLINE CHLORIDE 20 MG/ML IJ SOLN
100.0000 mg | Freq: Once | INTRAMUSCULAR | Status: AC
Start: 2017-06-13 — End: 2017-06-13
  Administered 2017-06-13: 100 mg via INTRAVENOUS

## 2017-06-13 MED ORDER — OSELTAMIVIR PHOSPHATE 6 MG/ML PO SUSR
30.0000 mg | Freq: Two times a day (BID) | ORAL | Status: DC
  Administered 2017-06-13 – 2017-06-17 (×9): 30 mg
  Filled 2017-06-13 (×10): qty 12.5

## 2017-06-13 MED ORDER — LORAZEPAM 2 MG/ML IJ SOLN
INTRAMUSCULAR | Status: AC
  Administered 2017-06-13: 0.5 mg
  Filled 2017-06-13: qty 1

## 2017-06-13 MED ORDER — CHLORHEXIDINE GLUCONATE CLOTH 2 % EX PADS: 6.0000 | MEDICATED_PAD | Freq: Every day | CUTANEOUS | Status: DC

## 2017-06-13 MED ORDER — MIDAZOLAM HCL 2 MG/2ML IJ SOLN
2.0000 mg | Freq: Once | INTRAMUSCULAR | Status: AC
  Administered 2017-06-13: 2 mg via INTRAVENOUS

## 2017-06-13 MED ORDER — FENTANYL CITRATE (PF) 100 MCG/2ML IJ SOLN
50.0000 ug | Freq: Once | INTRAMUSCULAR | Status: AC
  Administered 2017-06-13: 50 ug via INTRAVENOUS

## 2017-06-13 MED ORDER — ROCURONIUM BROMIDE 50 MG/5ML IV SOLN
100.0000 mg | Freq: Once | INTRAVENOUS | Status: AC
  Administered 2017-06-13: 100 mg via INTRAVENOUS
  Filled 2017-06-13 (×2): qty 10

## 2017-06-13 MED ORDER — LORAZEPAM 2 MG/ML IJ SOLN
2.0000 mg | Freq: Once | INTRAMUSCULAR | Status: AC
  Administered 2017-06-13: 2 mg via INTRAVENOUS
  Filled 2017-06-13: qty 1

## 2017-06-13 MED ORDER — HEPARIN SODIUM (PORCINE) 5000 UNIT/ML IJ SOLN: 5000.0000 [IU] | Freq: Three times a day (TID) | INTRAMUSCULAR | Status: DC

## 2017-06-13 MED ORDER — CHLORHEXIDINE GLUCONATE 0.12% ORAL RINSE (MEDLINE KIT)
15.0000 mL | Freq: Two times a day (BID) | OROMUCOSAL | Status: DC
  Administered 2017-06-13 – 2017-06-15 (×5): 15 mL via OROMUCOSAL

## 2017-06-13 MED ORDER — SODIUM CHLORIDE 0.9 % IV SOLN
250.0000 mL | INTRAVENOUS | Status: DC | PRN
  Administered 2017-06-14: 250 mL via INTRAVENOUS

## 2017-06-13 MED ORDER — SODIUM CHLORIDE 0.9 % IV BOLUS (SEPSIS)
500.0000 mL | Freq: Once | INTRAVENOUS | Status: AC
  Administered 2017-06-13: 500 mL via INTRAVENOUS

## 2017-06-13 MED ORDER — MIDAZOLAM HCL 2 MG/2ML IJ SOLN
1.0000 mg | INTRAMUSCULAR | Status: DC | PRN
  Administered 2017-06-13: 1 mg via INTRAVENOUS
  Filled 2017-06-13: qty 2

## 2017-06-13 MED ORDER — ROCURONIUM BROMIDE 50 MG/5ML IV SOLN
100.0000 mg | Freq: Once | INTRAVENOUS | Status: AC
  Administered 2017-06-13: 100 mg via INTRAVENOUS

## 2017-06-13 MED ORDER — FENTANYL 2500MCG IN NS 250ML (10MCG/ML) PREMIX INFUSION
25.0000 ug/h | INTRAVENOUS | Status: DC
  Administered 2017-06-13: 50 ug/h via INTRAVENOUS
  Administered 2017-06-14 (×2): 150 ug/h via INTRAVENOUS
  Administered 2017-06-15 (×2): 175 ug/h via INTRAVENOUS
  Administered 2017-06-16 – 2017-06-17 (×3): 200 ug/h via INTRAVENOUS
  Filled 2017-06-13 (×8): qty 250

## 2017-06-13 MED ORDER — ACETAMINOPHEN 650 MG RE SUPP
650.0000 mg | Freq: Once | RECTAL | Status: AC
  Administered 2017-06-13: 650 mg via RECTAL
  Filled 2017-06-13: qty 1

## 2017-06-13 MED ORDER — HEPARIN (PORCINE) IN NACL 100-0.45 UNIT/ML-% IJ SOLN
1200.0000 [IU]/h | INTRAMUSCULAR | Status: DC
  Administered 2017-06-13 – 2017-06-15 (×3): 1200 [IU]/h via INTRAVENOUS
  Filled 2017-06-13 (×5): qty 250

## 2017-06-13 MED ORDER — PIPERACILLIN-TAZOBACTAM 3.375 G IVPB
3.3750 g | Freq: Three times a day (TID) | INTRAVENOUS | Status: DC
  Administered 2017-06-13 – 2017-06-15 (×8): 3.375 g via INTRAVENOUS
  Filled 2017-06-13 (×10): qty 50

## 2017-06-13 MED ORDER — ATORVASTATIN CALCIUM 20 MG PO TABS
20.0000 mg | ORAL_TABLET | Freq: Every day | ORAL | Status: DC
  Administered 2017-06-13 – 2017-06-17 (×5): 20 mg
  Filled 2017-06-13 (×5): qty 1

## 2017-06-13 NOTE — Progress Notes (Signed)
PULMONARY / CRITICAL CARE MEDICINE   Name: Miguel [name] MRN: [id number] DOB: 1932-03-20    ADMISSION DATE:     HISTORY OF PRESENT ILLNESS:        84 year old male with past medical history as below, which is significant for rheumatoid arthritis, pulmonary embolism in 2015 still on anticoagulation, and depression on several medications including lithium.  He was in his usual state of health until Sunday 3/17 when he was noted by family members to be increasingly somnolent.  This somewhat improved in the following days but he developed progressive shortness of breath with associated minimally productive cough and eventually fevers.  He presented to [facility] emergency department on 3/20 with these complaints.  Immediately upon arrival to the emergency department he was noted to be febrile and profoundly hypoxemic with oxygen saturations in the 60s on room air.  He was escalated to higher flows of submental oxygen and was eventually placed on BiPAP.  Chest radiography concerning for pneumonia.  He was started on broad-spectrum antibiotics.  No known sick contacts. Laboratory evaluation significant for serum creatinine 2.02, anion gap 17, serum bicarb 15, troponin 2.56, lactic acid 8.94, and BNP 734.9.  PCCM has been asked to admit    PAST MEDICAL HISTORY :  He  has a past medical history of Arthritis, Cancer ([place]), Clotting disorder ([place]), and Depression.  PAST SURGICAL HISTORY: He  has a past surgical history that includes Prostate surgery and Spine surgery.  Allergies  Allergen Reactions  . Ibuprofen   . Oxaprozin   . Phenelzine Sulfate     REACTION: if taken with certain foods or other medications can cause an allergic reaction    No current facility-administered medications on file prior to encounter.    Current Outpatient Medications on File Prior to Encounter  Medication Sig  . ALPRAZolam (XANAX) 0.25 MG tablet Take 0.25 mg by mouth at bedtime as needed for sleep.  .  ARIPiprazole (ABILIFY) 2 MG tablet Take 2.5 mg by mouth every other day.   . Cholecalciferol (VITAMIN D3) 2000 UNITS TABS Take by mouth daily.  Marland Kitchen lithium carbonate 150 MG capsule Take 225 mg by mouth daily.   . Multiple Vitamin (MULTIVITAMIN) tablet Take 1 tablet by mouth daily.  . predniSONE (DELTASONE) 2.5 MG tablet TAKE 1 TABLET BY MOUTH WITH FOOD OR MILK EVERY DAY  . sertraline (ZOLOFT) 100 MG tablet Take 100 mg by mouth daily.  . Tofacitinib Citrate (XELJANZ) 5 MG TABS Take by mouth daily. Pt unsure of dose.  . traMADol (ULTRAM) 50 MG tablet Take 50 mg by mouth.  . warfarin (COUMADIN) 5 MG tablet Take by mouth. 5 MG daily except 2.5 MG on Mondays and Fridays.    FAMILY HISTORY:  His has no family status information on file.    SOCIAL HISTORY: He  reports that he has quit smoking. He quit smokeless tobacco use about 60 years ago. He reports that he does not drink alcohol or use drugs.  REVIEW OF SYSTEMS:   Not obtainable  SUBJECTIVE:       He is currently intubated mechanically ventilated and being supported with pressors.  His current dose of levo fed is 35 mcg/min.  He has adequate urine output of approximately 35-50 cc/h over the last 2 hours.  VITAL SIGNS: BP 114/62   Pulse 88   Temp (!) 103.3 F (39.6 C)   Resp (!) 28   Ht 5\' 7"  (1.702 m)   Wt 205 lb 0.4 oz (  93 kg)   SpO2 99%   BMI 32.11 kg/m   HEMODYNAMICS:    VENTILATOR SETTINGS: Vent Mode: PRVC FiO2 (%):  [100 %] 100 % Set Rate:  [20 bmp] 20 bmp Vt Set:  [530 mL] 530 mL PEEP:  [5 cmH20] 5 cmH20 Plateau Pressure:  [27 cmH20] 27 cmH20  INTAKE / OUTPUT: I/O last 3 completed shifts: In: 3340.4 [I.V.:790.4; IV [id number]:3474] Out: 150 [Urine:150]  PHYSICAL EXAMINATION: General: Elderly male who is orally intubated mechanically ventilated and sedated.  Not interactive.   HEENT: Pupils are equal and reactive he is a bilateral arcus.   Cardiovascular: S1 and S2 are regular without murmur rub or  gallop. Abdomen:  The abdomen is obese and soft with an easily reducible small ventral hernia.  There is no obvious masses tenderness guarding or rebound Lungs: Slightly labored in breathing substantially above the set ventilator rate.  There are good deal purulent secretions in the endotracheal tube.  There is symmetric air movement lots of scattered rhonchi and no wheezes. Musculoskeletal: No dependent edema.   LABS:  BMET Recent Labs  Lab 06/16/2017 2255 06/15/2017 2301 06/13/17 0534  NA 136 139 140  K 3.7 3.9 3.7  CL 104 107 111  CO2 15*  --  19*  BUN 20 22* 22*  CREATININE 2.02* 1.90* 1.85*  GLUCOSE 276* 275* 178*    Electrolytes Recent Labs  Lab 06/06/2017 2255 06/13/17 0534  CALCIUM 8.8* 7.2*  MG 2.1 1.5*  PHOS  --  2.1*    CBC Recent Labs  Lab 05/27/2017 2255 06/07/2017 2301  WBC 17.8*  --   HGB 10.3* 11.6*  HCT 33.4* 34.0*  PLT 230  --     Coag's Recent Labs  Lab 06/13/2017 2255  INR 1.57    Sepsis Markers Recent Labs  Lab 06/07/2017 2301 06/13/17 0029 06/13/17 0414  LATICACIDVEN 8.94* 4.9*  --   PROCALCITON  --   --  7.09    ABG Recent Labs  Lab 06/13/17 0046 06/13/17 0311 06/13/17 0521  PHART 7.256* 7.179* 7.233*  PCO2ART 35.5 41.7 42.0  PO2ART 89.0 57.0* 79.0*    Liver Enzymes Recent Labs  Lab 06/18/2017 2255  AST 50*  ALT 23  ALKPHOS 49  BILITOT 0.7  ALBUMIN 3.6    Cardiac Enzymes Recent Labs  Lab 06/13/17 0414  TROPONINI 8.69*    Glucose Recent Labs  Lab 06/13/17 0759  GLUCAP 145*    Imaging Dg Chest Port 1 View  Result Date: 06/13/2017 CLINICAL DATA:  Status post right-sided central line placement. EXAM: PORTABLE CHEST 1 VIEW COMPARISON:  Portable chest x-ray of June 13, 2017 FINDINGS: The lungs are adequately inflated. There are confluent airspace opacities bilaterally greatest on the left. There is no pneumothorax. The cardiac silhouette is enlarged. There is no significant pleural effusion. The pulmonary  vascularity is indistinct. The endotracheal tube tip projects 2.8 cm above the carina. The esophagogastric tube tip and proximal port project below the hemidiaphragm. The right internal jugular venous catheter tip projects over the midportion of the SVC. IMPRESSION: No postprocedure complication following right internal jugular venous catheter placement. Bilateral airspace opacification compatible with pneumonia or pulmonary edema or a combination of the two. The appearance of the lungs has deteriorated considerably since yesterday's study. Electronically Signed   By: David  Martinique M.D.   On: 06/13/2017 07:19   Dg Chest Port 1 View  Result Date: 06/13/2017 CLINICAL DATA:  Endotracheal tube placement. EXAM: PORTABLE CHEST 1 VIEW COMPARISON:  Radiograph yesterday. FINDINGS: Endotracheal tube tip 3.7 cm from the carina. Tip and side port of the enteric tube below the diaphragm in the stomach. Heart remains enlarged. Progressive airspace opacities throughout the left lung and right infrahilar lung. Retrocardiac opacity is now dense. Probable left pleural effusion. No pneumothorax. IMPRESSION: 1. Endotracheal tube tip 3.7 cm from the carina. Enteric tube in place with tip and side-port below the diaphragm. 2. Progressive multifocal airspace opacities that may be pneumonia, aspiration or pulmonary edema. This is most prominent in the retrocardiac left lung. Possible small left pleural effusion. 3. Unchanged cardiomegaly. Electronically Signed   By: Jeb Levering M.D.   On: 06/13/2017 04:18   Dg Chest Portable 1 View  Result Date: 05/29/2017 CLINICAL DATA:  Respiratory distress EXAM: PORTABLE CHEST 1 VIEW COMPARISON:  01/09/2009 FINDINGS: Mild cardiomegaly with vascular congestion. Bibasilar airspace disease. No pleural effusion. No pneumothorax. IMPRESSION: 1. Bibasilar airspace disease concerning for pneumonia 2. Cardiomegaly with mild vascular congestion Electronically Signed   By: Donavan Foil M.D.   On:  06/01/2017 23:34     STUDIES:  PCR is positive for influenza A  CULTURES: No culture results are yet reported  ANTIBIOTICS: Vancomycin Zosyn and Tamiflu started on 3/21   LINES/TUBES: Right IJ triple-lumen placed 3/21  DISCUSSION:      This is an 84 year old who appears to have a history of rheumatoid arthritis PE in the distant past and bipolar disorder who presented with a 2-day history of dyspnea and infiltrates on chest x-ray.  Has required intubation mechanical ventilation and pressors.  PCR is positive for influenza A.  He also has positive cardiac enzymes without any acute changes on EKG.  ASSESSMENT / PLAN:  PULMONARY A: He has developed an ARDS picture.  I am awaiting a blood gas to determine what ventilator adjustments need to be made.  Continue on his current empiric antibiotics and I am concerned that he may have a secondary bacterial infection based on the secretions from his endotracheal tube.     CARDIOVASCULAR A: Non-STEMI.  He is fully anticoagulated with heparin, I will be adding aspirin and a statin.  An echocardiogram is pending.  RENAL A: Appears he has CKD with a creatinine of 1.8 this morning   GASTROINTESTINAL A: Reflexes is with Protonix     HEMATOLOGIC A: He is on full dose anticoagulation with unfractionated heparin   INFECTIOUS A: As noted above he is influenza A positive and continues on Tamiflu and is being covered for secondary infection pending culture results.   ENDOCRINE A: On a scale insulin has been ordered     NEUROLOGIC A: He has a history of bipolar disorder.  Potentially unstable renal function I would like to continue to hold his lithium for now.  Greater than 32 minutes was spent in the care of this unstable patient today.   Lars Masson, MD Pulmonary and [place] Pager: [phone]  06/13/2017, 8:50 AM

## 2017-06-13 NOTE — Procedures (Signed)
Arterial Catheter Insertion Procedure Note Miguel [name] [id number] 1931/04/22  Procedure: Insertion of Arterial Catheter  Indications: Blood pressure monitoring  Procedure Details Consent: Risks of procedure as well as the alternatives and risks of each were explained to the (patient/caregiver).  Consent for procedure obtained. Time Out: Verified patient identification, verified procedure, site/side was marked, verified correct patient position, special equipment/implants available, medications/allergies/relevent history reviewed, required imaging and test results available.  Performed  Maximum sterile technique was used including antiseptics, cap, gloves, gown, hand hygiene, mask and sheet. Skin prep: Chlorhexidine; local anesthetic administered 20 gauge catheter was inserted into right femoral artery using the Seldinger technique.  Evaluation Blood flow good; BP tracing good. Complications: No apparent complications.  Georgann Housekeeper, AGACNP-BC [facility] Pulmonology/Critical Care Pager [phone] or [phone]  06/13/2017 6:37 AM

## 2017-06-13 NOTE — Progress Notes (Signed)
Art line attempted by 2 RT's, both unsuccessful in placement, RN aware.

## 2017-06-13 NOTE — Progress Notes (Signed)
Initial Nutrition Assessment  DOCUMENTATION CODES:   Not applicable  INTERVENTION:   As able recommend initiating enteral nutrition therapy.  Vital AF 1.2 @ 75 ml/hr (1800 m/day)  Provides: 2160 kcal, 135 grams protein, and 1459 ml free water.    NUTRITION DIAGNOSIS:   Increased nutrient needs related to acute illness(sepsis) as evidenced by estimated needs.  GOAL:   Patient will meet greater than or equal to 90% of their needs  MONITOR:   Vent status, I & O's  REASON FOR ASSESSMENT:   Ventilator    ASSESSMENT:   Pt with PMH of afib, CKD, RA, cancer admitted with flu and PNA, now with septic shock and intubated.    Pt discussed during ICU rounds and with RN. Positive fevers Requiring pressor support which is not yet stable   Patient is currently intubated on ventilator support MV: 14.5 Temp (24hrs), Avg:101.5 F (38.6 C), Min:97.7 F (36.5 C), Max:103.5 F (39.7 C)   Medications reviewed and include: solucortef, levophed @ 40 mcg (maxed), vaso @ .04 mcg MAP now up to 63 - a-line Labs reviewed: PO4 2.1 (L), magnesium 1.5 (L)   NUTRITION - FOCUSED PHYSICAL EXAM:    Most Recent Value  Orbital Region  No depletion  Upper Arm Region  No depletion  Thoracic and Lumbar Region  No depletion  Buccal Region  Unable to assess  Temple Region  No depletion  Clavicle Bone Region  No depletion  Clavicle and Acromion Bone Region  No depletion  Scapular Bone Region  No depletion  Dorsal Hand  No depletion  Patellar Region  No depletion  Anterior Thigh Region  No depletion  Posterior Calf Region  No depletion  Edema (RD Assessment)  None  Hair  Reviewed  Eyes  Unable to assess  Mouth  Unable to assess  Skin  Reviewed  Nails  Reviewed       Diet Order:  Diet NPO time specified  EDUCATION NEEDS:   No education needs have been identified at this time  Skin:  Skin Assessment: Reviewed RN Assessment  Last BM:  unknown  Height:   Ht Readings from Last 1  Encounters:  06/13/17 5\' 7"  (1.702 m)    Weight:   Wt Readings from Last 1 Encounters:  06/13/17 205 lb 0.4 oz (93 kg)  Admission weight likely 188 lb (85.4 kg)  Ideal Body Weight:  67.2 kg  BMI:  Body mass index is 32.11 kg/m.  Estimated Nutritional Needs:   Kcal:  2137  Protein:  120-140 grams  Fluid:  > 2L /day   Maylon Peppers RD, [place], [place] Pager [phone] After Hours Pager

## 2017-06-13 NOTE — Progress Notes (Signed)
ANTICOAGULATION CONSULT NOTE - Initial Consult  Pharmacy Consult for Heparin Indication: h/o PE  Allergies  Allergen Reactions  . Ibuprofen   . Oxaprozin   . Phenelzine Sulfate     REACTION: if taken with certain foods or other medications can cause an allergic reaction    Patient Measurements: Height: 5\' 7"  (170.2 cm) Weight: 200 lb (90.7 kg) IBW/kg (Calculated) : 66.1 Heparin Dosing Weight: 85 kg  Vital Signs: Temp: 101.3 F (38.5 C) (03/21 0053) Temp Source: Rectal (03/21 0053) BP: 162/74 (03/21 0200) Pulse Rate: 111 (03/21 0320)  Labs: Recent Labs    06/06/2017 2255 06/13/2017 2301  HGB 10.3* 11.6*  HCT 33.4* 34.0*  PLT 230  --   LABPROT 18.7*  --   INR 1.57  --   CREATININE 2.02* 1.90*    Estimated Creatinine Clearance: 31.6 mL/min (A) (by C-G formula based on SCr of 1.9 mg/dL (H)).   Medical History: Past Medical History:  Diagnosis Date  . Arthritis   . Cancer ([place])   . Clotting disorder ([place])   . Depression     Medications:  No current facility-administered medications on file prior to encounter.    Current Outpatient Medications on File Prior to Encounter  Medication Sig Dispense Refill  . ALPRAZolam (XANAX) 0.25 MG tablet Take 0.25 mg by mouth at bedtime as needed for sleep.    . ARIPiprazole (ABILIFY) 2 MG tablet Take 2.5 mg by mouth every other day.     . Cholecalciferol (VITAMIN D3) 2000 UNITS TABS Take by mouth daily.    Marland Kitchen lithium carbonate 150 MG capsule Take 225 mg by mouth daily.     . Multiple Vitamin (MULTIVITAMIN) tablet Take 1 tablet by mouth daily.    . predniSONE (DELTASONE) 2.5 MG tablet TAKE 1 TABLET BY MOUTH WITH FOOD OR MILK EVERY DAY  1  . sertraline (ZOLOFT) 100 MG tablet Take 100 mg by mouth daily.    . Tofacitinib Citrate (XELJANZ) 5 MG TABS Take by mouth daily. Pt unsure of dose.    . traMADol (ULTRAM) 50 MG tablet Take 50 mg by mouth.    . warfarin (COUMADIN) 5 MG tablet Take by mouth. 5 MG daily except 2.5 MG on Mondays  and Fridays.       Assessment: 84 y.o. male admitted with AMS/SOB, h/o PE and INR subtherapeutic, for heparin  Goal of Therapy:  Heparin level 0.3-0.7 units/ml Monitor platelets by anticoagulation protocol: Yes   Plan:  Start heparin 1200 units/hr Check heparin level in 8 hours.   Caryl Pina 06/13/2017,3:47 AM

## 2017-06-13 NOTE — ED Notes (Addendum)
Admitting at bedside 

## 2017-06-13 NOTE — Procedures (Signed)
Endotracheal Intubation Procedure Note Indication for endotracheal intubation: impending respiratory failure Sedation: etomidate and midazolam Paralytic: succinylcholine Equipment: Macintosh 4 laryngoscope blade and 7.48mm cuffed endotracheal tube Cricoid Pressure: yes Number of attempts: 1 ETT location confirmed by by auscultation, by CXR and ETCO2 monitor.  Patient with RR 40, Accessory muscle use (while on BIPAP), and less responsive (opening eyes only to sternal rub). Decision made to intubate with verbal consent by patient's wife. Tolerated procedure well.

## 2017-06-13 NOTE — Progress Notes (Signed)
Family at bedside and updated of patient's status. Waiting for MD to update to make further decisions. Deatrice Spanbauer, Rande Brunt, RN

## 2017-06-13 NOTE — H&P (Addendum)
PULMONARY / CRITICAL CARE MEDICINE   Name: Miguel [name] MRN: [id number] DOB: [birth year]    ADMISSION DATE:  05/25/2017 CONSULTATION DATE: 3/21  REFERRING MD: Dr. Leonides Schanz EDP  CHIEF COMPLAINT: Dyspnea  HISTORY OF PRESENT ILLNESS:   84 year old male with past medical history as below, which is significant for rheumatoid arthritis, pulmonary embolism in 2015 still on anticoagulation, and depression on several medications including lithium.  He was in his usual state of health until Sunday 3/17 when he was noted by family members to be increasingly somnolent.  This somewhat improved in the following days but he developed progressive shortness of breath with associated minimally productive cough and eventually fevers.  He presented to [facility] emergency department on 3/20 with these complaints.  Immediately upon arrival to the emergency department he was noted to be febrile and profoundly hypoxemic with oxygen saturations in the 60s on room air.  He was escalated to higher flows of submental oxygen and was eventually placed on BiPAP.  Chest radiography concerning for pneumonia.  He was started on broad-spectrum antibiotics.  No known sick contacts. Laboratory evaluation significant for serum creatinine 2.02, anion gap 17, serum bicarb 15, troponin 2.56, lactic acid 8.94, and BNP 734.9.  PCCM has been asked to admit  PAST MEDICAL HISTORY :  He  has a past medical history of Arthritis, Cancer ([place]), Clotting disorder ([place]), and Depression.  PAST SURGICAL HISTORY: He  has a past surgical history that includes Prostate surgery and Spine surgery.  Allergies  Allergen Reactions  . Ibuprofen   . Oxaprozin   . Phenelzine Sulfate     REACTION: if taken with certain foods or other medications can cause an allergic reaction    No current facility-administered medications on file prior to encounter.    Current Outpatient Medications on File Prior to Encounter  Medication Sig  . ALPRAZolam  (XANAX) 0.25 MG tablet Take 0.25 mg by mouth at bedtime as needed for sleep.  . ARIPiprazole (ABILIFY) 2 MG tablet Take 2.5 mg by mouth every other day.   . Cholecalciferol (VITAMIN D3) 2000 UNITS TABS Take by mouth daily.  Marland Kitchen lithium carbonate 150 MG capsule Take 225 mg by mouth daily.   . Multiple Vitamin (MULTIVITAMIN) tablet Take 1 tablet by mouth daily.  . predniSONE (DELTASONE) 2.5 MG tablet TAKE 1 TABLET BY MOUTH WITH FOOD OR MILK EVERY DAY  . sertraline (ZOLOFT) 100 MG tablet Take 100 mg by mouth daily.  . Tofacitinib Citrate (XELJANZ) 5 MG TABS Take by mouth daily. Pt unsure of dose.  . traMADol (ULTRAM) 50 MG tablet Take 50 mg by mouth.  . warfarin (COUMADIN) 5 MG tablet Take by mouth. 5 MG daily except 2.5 MG on Mondays and Fridays.    FAMILY HISTORY:  His has no family status information on file.    SOCIAL HISTORY: He  reports that he has quit smoking. He quit smokeless tobacco use about 60 years ago. He reports that he does not drink alcohol or use drugs.  REVIEW OF SYSTEMS:  Limited due to BiPAP Bolds are positive  Constitutional: weight loss, gain, night sweats, Fevers, chills, fatigue .  HEENT: headaches, Sore throat, sneezing, nasal congestion, post nasal drip, Difficulty swallowing, Tooth/dental problems, visual complaints visual changes, ear ache CV:  chest pain, radiates:,Orthopnea, PND, swelling in lower extremities: L only this is chornic, dizziness, palpitations, syncope.  GI  heartburn, indigestion, abdominal pain, nausea, vomiting, diarrhea, change in bowel habits, loss of appetite, bloody stools.  Resp:  cough, productive: worse in mornings, hemoptysis, dyspnea, chest pain, pleuritic.  Skin: rash or itching or icterus GU: dysuria, change in color of urine, urgency or frequency. flank pain, hematuria  MS: joint pain or swelling. decreased range of motion  Psych: change in mood or affect. depression or anxiety.  Neuro: difficulty with speech, weakness, numbness,  ataxia    SUBJECTIVE:    VITAL SIGNS: BP 131/73   Pulse 84   Temp (!) 101.3 F (38.5 C) (Rectal)   Resp (!) 29   Ht 5\' 7"  (1.702 m)   Wt 90.7 kg (200 lb)   SpO2 99%   BMI 31.32 kg/m   HEMODYNAMICS:    VENTILATOR SETTINGS:    INTAKE / OUTPUT: No intake/output data recorded.  PHYSICAL EXAMINATION: General:  Elderly male in mild respiratory distress on BiPAP Neuro:  Alert, oriented, non-focal HEENT:  [place]/AT, PERRL, No JVD Cardiovascular:  Tachy. Regular, no MRG. No peripheral edema.  Lungs:  Coarse bilateral breath sounds Abdomen:  Soft, non-tender, non-distended Musculoskeletal:  No acute deformity or ROM limitation.  Skin:  Grossly intact  LABS:  BMET Recent Labs  Lab 06/21/2017 2255 06/11/2017 2301  NA 136 139  K 3.7 3.9  CL 104 107  CO2 15*  --   BUN 20 22*  CREATININE 2.02* 1.90*  GLUCOSE 276* 275*    Electrolytes Recent Labs  Lab 06/16/2017 2255  CALCIUM 8.8*  MG 2.1    CBC Recent Labs  Lab 06/11/2017 2255 06/18/2017 2301  WBC 17.8*  --   HGB 10.3* 11.6*  HCT 33.4* 34.0*  PLT 230  --     Coag's Recent Labs  Lab 06/07/2017 2255  INR 1.57    Sepsis Markers Recent Labs  Lab 05/28/2017 2301 06/13/17 0029  LATICACIDVEN 8.94* 4.9*    ABG Recent Labs  Lab 06/16/2017 2257 06/13/17 0046  PHART 7.187* 7.256*  PCO2ART 40.2 35.5  PO2ART 68.0* 89.0    Liver Enzymes Recent Labs  Lab 06/03/2017 2255  AST 50*  ALT 23  ALKPHOS 49  BILITOT 0.7  ALBUMIN 3.6    Cardiac Enzymes No results for input(s): TROPONINI, PROBNP in the last 168 hours.  Glucose No results for input(s): GLUCAP in the last 168 hours.  Imaging Dg Chest Portable 1 View  Result Date: 06/08/2017 CLINICAL DATA:  Respiratory distress EXAM: PORTABLE CHEST 1 VIEW COMPARISON:  01/09/2009 FINDINGS: Mild cardiomegaly with vascular congestion. Bibasilar airspace disease. No pleural effusion. No pneumothorax. IMPRESSION: 1. Bibasilar airspace disease concerning for pneumonia 2.  Cardiomegaly with mild vascular congestion Electronically Signed   By: Donavan Foil M.D.   On: 05/30/2017 23:34     STUDIES:  Admission CXR: Bibasilar airspace disease concerning for pneumonia.   CULTURES: Blood 3/20 > Urine 3/20 > Tracheal aspirate 3/20 > RVP 3/20 >  ANTIBIOTICS: Zosyn 3/20 > Vancomycin 3/20 > Tamiflu 3/20 >  SIGNIFICANT EVENTS: 3/20, 3/21 > presented, admit to ICU for respiratory failure  LINES/TUBES: ETT 3/21 >  DISCUSSION: 84 year old male admitted 3/21 AM for influenza   ASSESSMENT / PLAN:  PULMONARY A: Acute hypoxemic respiratory failure secondary to influenza pneumonia  Hisotry of PE (2015) still on warfarin.   P:   STAT intubation CXR for ETT placement ABG VAP bundle Heparin infusion  CARDIOVASCULAR A:  Suspect acute on chronic CHF Shock: likely septic developed post intubation Elevated troponin Likely secondary to demand ischemia.   P:  Telemetry monitoring MAP goal > 65 mmHg IVF resuscitation Levophed for MAP  goal Trend troponin, on heparin infusion.  Ensure lactic clearing Echocardiogram  RENAL A:   AKI Metabolic acidosis  P:   Hydrate Follow BMP Sodium bicarb infusion  GASTROINTESTINAL A:   No acute issues  P:   NPO PPI  HEMATOLOGIC A:   Anemia - hemoglobin at about baseline  P:  Follow CBC Heparin infusion per pharmacy  INFECTIOUS A:   Sepsis: suspected secondary to PNA, possibly flu.   P:   ABX as above Follow cultures, viral swab PCT  ENDOCRINE A:   Hyperglycemia with no history of DM. (of note was started on prednisone for RA related pain 3/20)  P:   CBG monitoring and SSI  NEUROLOGIC A:   Acute metabolic encephalopathy  P:   RASS goal: -1 to -2 Fentanyl infusion and PRN versed.  Monitor   FAMILY  - Updates: Wife and daughter updated at length in the ED. Partial code: intubation only.   - Inter-disciplinary family meet or Palliative Care meeting due by:  3/27   Georgann Housekeeper, AGACNP-BC [facility] Pulmonology/Critical Care Pager [phone] or [phone]  06/13/2017 3:14 AM

## 2017-06-13 NOTE — Progress Notes (Signed)
Patient's ABG reviewed with Dr. Pearline Cables. Troponin 16.58. Orders for Ativan, Rocuronium, and to increase RR to 28. Respiratory updated and will draw another ABG in an hour. O2 maintaining in the upper 80's on 100% FiO2. Monitoring closely.  Gonsalo Cuthbertson, Rande Brunt, RN

## 2017-06-13 NOTE — ED Provider Notes (Signed)
Medical screening examination/treatment/procedure(s) were conducted as a shared visit with non-physician practitioner(s) and myself.  I personally evaluated the patient during the encounter.   EKG Interpretation  Date/Time:  Wednesday June 12 2017 22:41:10 EDT Ventricular Rate:  117 PR Interval:    QRS Duration: 141 QT Interval:  337 QTC Calculation: 471 R Axis:   -39 Text Interpretation:  Sinus tachycardia LVH with IVCD, LAD and secondary repol abnrm sltr qrs widening Otherwise no significant change Confirmed by Deno Etienne [phone]) on 06/19/2017 10:49:09 PM       Patient is an 84 year old male who presented to the emergency department with EMS in respiratory distress.  Reports fever at home of 101.  Has had productive cough.  Does not wear oxygen chronically.  On exam, patient is tachycardic, tachypneic but afebrile.  Will obtain rectal temperature.  He is not hypotensive.  His rhonchorous breath sounds diffusely and is improving on BiPAP.  Labs show metabolic acidosis likely from elevated lactate.  I am concerned this is from sepsis versus respiratory distress.  He does have a leukocytosis with left shift.  He also has an elevated troponin.  No chest pain at this time.  Likely from demand ischemia.  He will receive broad-spectrum antibiotics.  Chest x-ray concerning for vascular congestion and bibasilar pneumonia.  Will send flu swab and patient is more stable and can come off BiPAP.  We will give IV fluids but do so cautiously given there is vascular congestion on his chest x-ray.  Will recheck frequently after IV fluid boluses.  Patient will need admission.  At this time he is a full code.   Ward, Delice Bison, DO 06/13/17 0004

## 2017-06-13 NOTE — Procedures (Signed)
Central Venous Catheter Insertion Procedure Note Miguel [name] [id number] [birth year]  Procedure: Insertion of Central Venous Catheter Indications: Assessment of intravascular volume, Drug and/or fluid administration and Frequent blood sampling  Procedure Details Consent: Risks of procedure as well as the alternatives and risks of each were explained to the (patient/caregiver).  Consent for procedure obtained. Time Out: Verified patient identification, verified procedure, site/side was marked, verified correct patient position, special equipment/implants available, medications/allergies/relevent history reviewed, required imaging and test results available.  Performed  Maximum sterile technique was used including antiseptics, cap, gloves, gown, hand hygiene, mask and sheet. Skin prep: Chlorhexidine; local anesthetic administered A antimicrobial bonded/coated triple lumen catheter was placed in the right internal jugular vein using the Seldinger technique. Ultrasound guidance used.Yes.   Catheter placed to 17 cm. Blood aspirated via all 3 ports and then flushed x 3. Line sutured x 2 and dressing applied.  Evaluation Blood flow good Complications: No apparent complications Patient did tolerate procedure well. Chest X-ray ordered to verify placement.  CXR: pending.  Georgann Housekeeper, AGACNP-BC [facility] Pulmonology/Critical Care Pager [phone] or [phone]  06/13/2017 6:36 AM

## 2017-06-13 NOTE — Progress Notes (Signed)
Pharmacy Antibiotic Note  Miguel [name] is a 84 y.o. male admitted on 06/11/2017 with possible pneumonia. Also flu + Starting empiric abx.   Plan: -Vancomycin 1750 mg IV x1 then 1g/24h -Zosyn 3.37g IV q8h -Monitor renal fx, cultures, VT as needed  Height: 5\' 7"  (170.2 cm) Weight: 205 lb 0.4 oz (93 kg) IBW/kg (Calculated) : 66.1  Temp (24hrs), Avg:100.4 F (38 C), Min:98.7 F (37.1 C), Max:101.3 F (38.5 C)  Recent Labs  Lab 05/28/2017 2255 05/24/2017 2301 06/13/17 0029  WBC 17.8*  --   --   CREATININE 2.02* 1.90*  --   LATICACIDVEN  --  8.94* 4.9*    Estimated Creatinine Clearance: 32 mL/min (A) (by C-G formula based on SCr of 1.9 mg/dL (H)).    Allergies  Allergen Reactions  . Ibuprofen   . Oxaprozin   . Phenelzine Sulfate     REACTION: if taken with certain foods or other medications can cause an allergic reaction    Antimicrobials this admission: 3/21 vancomycin > 3/21 zosyn >  Dose adjustments this admission: N/A  Microbiology results: 3/20 blood cx: 3/30 RVP: Sallyanne Havers   Zarion Oliff, Jake Church 06/13/2017 6:18 AM

## 2017-06-13 NOTE — Progress Notes (Signed)
ANTICOAGULATION CONSULT NOTE - Initial Consult  Pharmacy Consult for Heparin Indication: h/o PE  Allergies  Allergen Reactions  . Ibuprofen   . Oxaprozin   . Phenelzine Sulfate     REACTION: if taken with certain foods or other medications can cause an allergic reaction    Patient Measurements: Height: 5\' 7"  (170.2 cm) Weight: 205 lb 0.4 oz (93 kg) IBW/kg (Calculated) : 66.1 Heparin Dosing Weight: 85 kg  Vital Signs: Temp: 100.2 F (37.9 C) (03/21 1300) Temp Source: Axillary (03/21 0400) BP: 87/47 (03/21 1300) Pulse Rate: 79 (03/21 1300)  Labs: Recent Labs    05/31/2017 2255 05/30/2017 2301 06/13/17 0414 06/13/17 0534 06/13/17 0828 06/13/17 1225  HGB 10.3* 11.6*  --   --   --   --   HCT 33.4* 34.0*  --   --   --   --   PLT 230  --   --   --   --   --   LABPROT 18.7*  --   --   --   --   --   INR 1.57  --   --   --   --   --   HEPARINUNFRC  --   --   --   --   --  0.47  CREATININE 2.02* 1.90*  --  1.85*  --   --   TROPONINI  --   --  8.69*  --  16.58*  --     Estimated Creatinine Clearance: 32.9 mL/min (A) (by C-G formula based on SCr of 1.85 mg/dL (H)).   Assessment: 84 y.o. male admitted with AMS/SOB, h/o PE and INR subtherapeutic, for heparin  Hep lvl therapeutic 0.47  Goal of Therapy:  Heparin level 0.3-0.7 units/ml Monitor platelets by anticoagulation protocol: Yes   Plan:  -continue heparin 1200 units/hr -daily hl cbc  Levester Fresh, PharmD, BCPS, BCCCP Clinical Pharmacist Clinical phone for 06/13/2017 from 7a-3:30p: [phone] If after 3:30p, please call main pharmacy at: [phone] 06/13/2017 1:56 PM

## 2017-06-13 NOTE — Progress Notes (Signed)
  Echocardiogram 2D Echocardiogram has been performed.  Karrington Mccravy T Veer Elamin 06/13/2017, 9:46 AM

## 2017-06-14 ENCOUNTER — Inpatient Hospital Stay (HOSPITAL_COMMUNITY): Payer: Medicare Other

## 2017-06-14 LAB — POCT I-STAT 3, ART BLOOD GAS (G3+)
Acid-Base Excess: 1 mmol/L (ref 0.0–2.0)
BICARBONATE: 24.5 mmol/L (ref 20.0–28.0)
Bicarbonate: 22.7 mmol/L (ref 20.0–28.0)
O2 Saturation: 99 %
O2 Saturation: 96 %
PCO2 ART: 37 mmHg (ref 32.0–48.0)
PH ART: 7.434 (ref 7.350–7.450)
PO2 ART: 84 mmHg (ref 83.0–108.0)
Patient temperature: 37.8
Patient temperature: 38.3
TCO2: 24 mmol/L (ref 22–32)
TCO2: 26 mmol/L (ref 22–32)
pCO2 arterial: 29.8 mmHg — ABNORMAL LOW (ref 32.0–48.0)
pH, Arterial: 7.493 — ABNORMAL HIGH (ref 7.350–7.450)
pO2, Arterial: 131 mmHg — ABNORMAL HIGH (ref 83.0–108.0)

## 2017-06-14 LAB — BASIC METABOLIC PANEL
ANION GAP: 16 — AB (ref 5–15)
BUN: 31 mg/dL — ABNORMAL HIGH (ref 6–20)
CHLORIDE: 91 mmol/L — AB (ref 101–111)
CO2: 23 mmol/L (ref 22–32)
Calcium: 5.9 mg/dL — CL (ref 8.9–10.3)
Creatinine, Ser: 2.28 mg/dL — ABNORMAL HIGH (ref 0.61–1.24)
GFR calc non Af Amer: 25 mL/min — ABNORMAL LOW (ref >60)
GFR, EST AFRICAN AMERICAN: 29 mL/min — AB (ref >60)
Glucose, Bld: 144 mg/dL — ABNORMAL HIGH (ref 65–99)
POTASSIUM: 3.3 mmol/L — AB (ref 3.5–5.1)
SODIUM: 130 mmol/L — AB (ref 135–145)

## 2017-06-14 LAB — CBC WITH DIFFERENTIAL/PLATELET
BASOS PCT: 0 %
BLASTS: 0 %
Band Neutrophils: 41 %
Basophils Absolute: 0 10*3/uL (ref 0.0–0.1)
Eosinophils Absolute: 0 10*3/uL (ref 0.0–0.7)
Eosinophils Relative: 0 %
HCT: 28.6 % — ABNORMAL LOW (ref 39.0–52.0)
HEMOGLOBIN: 9.1 g/dL — AB (ref 13.0–17.0)
Lymphocytes Relative: 10 %
Lymphs Abs: 1.5 10*3/uL (ref 0.7–4.0)
MCH: 30.6 pg (ref 26.0–34.0)
MCHC: 31.8 g/dL (ref 30.0–36.0)
MCV: 96.3 fL (ref 78.0–100.0)
MONO ABS: 0.1 10*3/uL (ref 0.1–1.0)
MYELOCYTES: 3 %
Metamyelocytes Relative: 21 %
Monocytes Relative: 1 %
NEUTROS PCT: 24 %
NRBC: 0 /100{WBCs}
Neutro Abs: 13.1 10*3/uL — ABNORMAL HIGH (ref 1.7–7.7)
Other: 0 %
PLATELETS: 162 10*3/uL (ref 150–400)
PROMYELOCYTES ABS: 0 %
RBC: 2.97 MIL/uL — AB (ref 4.22–5.81)
RDW: 14.7 % (ref 11.5–15.5)
WBC MORPHOLOGY: INCREASED
WBC: 14.7 10*3/uL — AB (ref 4.0–10.5)

## 2017-06-14 LAB — BLOOD GAS, ARTERIAL
Acid-base deficit: 3.4 mmol/L — ABNORMAL HIGH (ref 0.0–2.0)
Acid-base deficit: 1.8 mmol/L (ref 0.0–2.0)
BICARBONATE: 21.3 mmol/L (ref 20.0–28.0)
Bicarbonate: 22.3 mmol/L (ref 20.0–28.0)
DRAWN BY: 39899
Drawn by: 511551
FIO2: 100
FIO2: 100
LHR: 28 {breaths}/min
MECHVT: 530 mL
O2 SAT: 99.1 %
O2 SAT: 98.1 %
PATIENT TEMPERATURE: 101.1
PCO2 ART: 42.4 mmHg (ref 32.0–48.0)
PCO2 ART: 36.8 mmHg (ref 32.0–48.0)
PEEP: 12 cmH2O
PEEP: 12 cmH2O
PO2 ART: 135 mmHg — AB (ref 83.0–108.0)
PO2 ART: 96 mmHg (ref 83.0–108.0)
Patient temperature: 98.9
VT: 530 mL
pH, Arterial: 7.33 — ABNORMAL LOW (ref 7.350–7.450)
pH, Arterial: 7.4 (ref 7.350–7.450)

## 2017-06-14 LAB — POCT I-STAT, CHEM 8
BUN: 28 mg/dL — ABNORMAL HIGH (ref 6–20)
CHLORIDE: 90 mmol/L — AB (ref 101–111)
Calcium, Ion: 0.83 mmol/L — CL (ref 1.15–1.40)
Creatinine, Ser: 2.1 mg/dL — ABNORMAL HIGH (ref 0.61–1.24)
Glucose, Bld: 136 mg/dL — ABNORMAL HIGH (ref 65–99)
HCT: 23 % — ABNORMAL LOW (ref 39.0–52.0)
Hemoglobin: 7.8 g/dL — ABNORMAL LOW (ref 13.0–17.0)
POTASSIUM: 3.3 mmol/L — AB (ref 3.5–5.1)
SODIUM: 129 mmol/L — AB (ref 135–145)
TCO2: 24 mmol/L (ref 22–32)

## 2017-06-14 LAB — COMPREHENSIVE METABOLIC PANEL
ALT: 65 U/L — AB (ref 17–63)
AST: 127 U/L — AB (ref 15–41)
Albumin: 2.4 g/dL — ABNORMAL LOW (ref 3.5–5.0)
Alkaline Phosphatase: 21 U/L — ABNORMAL LOW (ref 38–126)
Anion gap: 17 — ABNORMAL HIGH (ref 5–15)
BUN: 30 mg/dL — AB (ref 6–20)
CHLORIDE: 94 mmol/L — AB (ref 101–111)
CO2: 20 mmol/L — ABNORMAL LOW (ref 22–32)
CREATININE: 2.51 mg/dL — AB (ref 0.61–1.24)
Calcium: 6.2 mg/dL — CL (ref 8.9–10.3)
GFR calc Af Amer: 26 mL/min — ABNORMAL LOW (ref >60)
GFR, EST NON AFRICAN AMERICAN: 22 mL/min — AB (ref >60)
Glucose, Bld: 101 mg/dL — ABNORMAL HIGH (ref 65–99)
Potassium: 3.7 mmol/L (ref 3.5–5.1)
Sodium: 131 mmol/L — ABNORMAL LOW (ref 135–145)
Total Bilirubin: 0.9 mg/dL (ref 0.3–1.2)
Total Protein: 5 g/dL — ABNORMAL LOW (ref 6.5–8.1)

## 2017-06-14 LAB — POCT I-STAT 3, VENOUS BLOOD GAS (G3P V)
Acid-base deficit: 1 mmol/L (ref 0.0–2.0)
BICARBONATE: 23.4 mmol/L (ref 20.0–28.0)
O2 Saturation: 65 %
PH VEN: 7.423 (ref 7.250–7.430)
TCO2: 24 mmol/L (ref 22–32)
pCO2, Ven: 35.8 mmHg — ABNORMAL LOW (ref 44.0–60.0)
pO2, Ven: 33 mmHg (ref 32.0–45.0)

## 2017-06-14 LAB — GLUCOSE, CAPILLARY
GLUCOSE-CAPILLARY: 146 mg/dL — AB (ref 65–99)
GLUCOSE-CAPILLARY: 151 mg/dL — AB (ref 65–99)
GLUCOSE-CAPILLARY: 85 mg/dL (ref 65–99)
Glucose-Capillary: 120 mg/dL — ABNORMAL HIGH (ref 65–99)
Glucose-Capillary: 104 mg/dL — ABNORMAL HIGH (ref 65–99)
Glucose-Capillary: 105 mg/dL — ABNORMAL HIGH (ref 65–99)
Glucose-Capillary: 122 mg/dL — ABNORMAL HIGH (ref 65–99)

## 2017-06-14 LAB — LACTIC ACID, PLASMA
LACTIC ACID, VENOUS: 5.3 mmol/L — AB (ref 0.5–1.9)
Lactic Acid, Venous: 5.4 mmol/L (ref 0.5–1.9)

## 2017-06-14 LAB — TROPONIN I: Troponin I: 13.08 ng/mL (ref <0.03)

## 2017-06-14 LAB — HEPARIN LEVEL (UNFRACTIONATED): Heparin Unfractionated: 0.68 IU/mL (ref 0.30–0.70)

## 2017-06-14 LAB — MAGNESIUM
MAGNESIUM: 1.4 mg/dL — AB (ref 1.7–2.4)
Magnesium: 1.5 mg/dL — ABNORMAL LOW (ref 1.7–2.4)

## 2017-06-14 LAB — PROCALCITONIN: Procalcitonin: 73.77 ng/mL

## 2017-06-14 MED ORDER — MIDAZOLAM HCL 2 MG/2ML IJ SOLN
2.0000 mg | Freq: Once | INTRAMUSCULAR | Status: AC
  Administered 2017-06-14: 2 mg via INTRAVENOUS

## 2017-06-14 MED ORDER — SODIUM CHLORIDE 0.9 % IV SOLN
1.0000 g | Freq: Once | INTRAVENOUS | Status: AC
  Administered 2017-06-14: 1 g via INTRAVENOUS
  Filled 2017-06-14: qty 10

## 2017-06-14 MED ORDER — MAGNESIUM SULFATE 2 GM/50ML IV SOLN
2.0000 g | Freq: Once | INTRAVENOUS | Status: AC
  Administered 2017-06-14: 2 g via INTRAVENOUS
  Filled 2017-06-14: qty 50

## 2017-06-14 MED ORDER — VANCOMYCIN HCL IN DEXTROSE 1-5 GM/200ML-% IV SOLN: 1000.0000 mg | INTRAVENOUS | Status: DC

## 2017-06-14 MED ORDER — LACTATED RINGERS IV SOLN
INTRAVENOUS | Status: DC
  Administered 2017-06-14 – 2017-06-15 (×2): via INTRAVENOUS

## 2017-06-14 MED ORDER — SODIUM CHLORIDE 0.9 % IV SOLN
3.0000 ug/kg/min | INTRAVENOUS | Status: DC
  Administered 2017-06-14 – 2017-06-16 (×4): 3 ug/kg/min via INTRAVENOUS
  Filled 2017-06-14 (×5): qty 20

## 2017-06-14 MED ORDER — VANCOMYCIN HCL 10 G IV SOLR
1500.0000 mg | INTRAVENOUS | Status: DC
  Administered 2017-06-15: 1500 mg via INTRAVENOUS
  Filled 2017-06-14 (×2): qty 1500

## 2017-06-14 MED ORDER — MIDAZOLAM BOLUS VIA INFUSION
2.0000 mg | INTRAVENOUS | Status: DC | PRN
  Filled 2017-06-14: qty 2

## 2017-06-14 MED ORDER — SODIUM CHLORIDE 0.9 % IV SOLN
2.0000 mg/h | INTRAVENOUS | Status: DC
  Administered 2017-06-14 (×2): 2 mg/h via INTRAVENOUS
  Administered 2017-06-16: 2.5 mg/h via INTRAVENOUS
  Administered 2017-06-16: 3 mg/h via INTRAVENOUS
  Filled 2017-06-14 (×5): qty 10

## 2017-06-14 MED ORDER — VITAL AF 1.2 CAL PO LIQD
1000.0000 mL | ORAL | Status: DC
Start: 2017-06-14 — End: 2017-06-18
  Administered 2017-06-14 – 2017-06-17 (×2): 1000 mL

## 2017-06-14 MED ORDER — SODIUM CHLORIDE 0.9 % IV BOLUS (SEPSIS)
500.0000 mL | Freq: Once | INTRAVENOUS | Status: AC
  Administered 2017-06-14: 500 mL via INTRAVENOUS

## 2017-06-14 MED ORDER — ALBUTEROL SULFATE (2.5 MG/3ML) 0.083% IN NEBU
2.5000 mg | INHALATION_SOLUTION | RESPIRATORY_TRACT | Status: DC | PRN
Start: 2017-06-14 — End: 2017-06-18
  Administered 2017-06-16: 2.5 mg via RESPIRATORY_TRACT
  Filled 2017-06-14 (×2): qty 3

## 2017-06-14 MED ORDER — MIDAZOLAM HCL 2 MG/2ML IJ SOLN: 2.0000 mg | Freq: Once | INTRAMUSCULAR | Status: DC | PRN

## 2017-06-14 MED ORDER — ARTIFICIAL TEARS OPHTHALMIC OINT
1.0000 "application " | TOPICAL_OINTMENT | Freq: Three times a day (TID) | OPHTHALMIC | Status: DC
  Administered 2017-06-14 – 2017-06-17 (×9): 1 via OPHTHALMIC
  Filled 2017-06-14: qty 3.5

## 2017-06-14 MED ORDER — POTASSIUM CHLORIDE 10 MEQ/50ML IV SOLN
10.0000 meq | INTRAVENOUS | Status: AC
  Administered 2017-06-14 (×4): 10 meq via INTRAVENOUS
  Filled 2017-06-14 (×4): qty 50

## 2017-06-14 MED ORDER — CISATRACURIUM BOLUS VIA INFUSION
5.0000 mg | Freq: Once | INTRAVENOUS | Status: AC
  Administered 2017-06-14: 5 mg via INTRAVENOUS
  Filled 2017-06-14: qty 5

## 2017-06-14 MED ORDER — THIAMINE HCL 100 MG/ML IJ SOLN
100.0000 mg | Freq: Every day | INTRAMUSCULAR | Status: DC
  Administered 2017-06-14 – 2017-06-17 (×4): 100 mg via INTRAVENOUS
  Filled 2017-06-14 (×4): qty 2

## 2017-06-14 NOTE — Progress Notes (Signed)
Patients SP02 on monitor was reading in the 80's. After checking SP02 probe and replacing it SP02 on monitor still remained in the 80's. MD was made aware. MD was not concerned with the SP02 on the monitor because the patients recent ABG was WNL's and he felt SP02 was not accurate. Will continue to monitor patient.

## 2017-06-14 NOTE — Progress Notes (Signed)
eLink Physician-Brief Progress Note Patient Name: Miguel [name] DOB: [birth year] MRN: [id number]   Date of Service  06/14/2017  HPI/Events of Note    eICU Interventions  Paralytics restarted due to vent dyssynchrony and desaturations.      Intervention Category Major Interventions: Other:  Collene Gobble 06/14/2017, 5:52 AM

## 2017-06-14 NOTE — Progress Notes (Signed)
ANTICOAGULATION & ANTIBIOTIC CONSULT NOTE - Follow Up Consult  Pharmacy Consult for Heparin Indication: h/o PE  Allergies  Allergen Reactions  . Ibuprofen   . Oxaprozin   . Phenelzine Sulfate     REACTION: if taken with certain foods or other medications can cause an allergic reaction    Patient Measurements: Height: 5\' 7"  (170.2 cm) Weight: 224 lb 3.3 oz (101.7 kg) IBW/kg (Calculated) : 66.1 Heparin Dosing Weight: 85 kg  Vital Signs: Temp: 99 F (37.2 C) (03/22 0800) BP: 95/58 (03/22 0800) Pulse Rate: 67 (03/22 0700)  Labs: Recent Labs    05/30/2017 2255 05/30/2017 2301  06/13/17 0534 06/13/17 0828 06/13/17 1225 06/13/17 1630 06/13/17 2306 06/14/17 0500  HGB 10.3* 11.6*  --   --   --   --   --   --  9.1*  HCT 33.4* 34.0*  --   --   --   --   --   --  28.6*  PLT 230  --   --   --   --   --   --   --  162  LABPROT 18.7*  --   --   --   --   --   --   --   --   INR 1.57  --   --   --   --   --   --   --   --   HEPARINUNFRC  --   --   --   --   --  0.47  --   --  0.68  CREATININE 2.02* 1.90*  --  1.85*  --   --   --   --  2.51*  TROPONINI  --   --    < >  --  16.58*  --  12.75* 13.08*  --    < > = values in this interval not displayed.    Estimated Creatinine Clearance: 25.3 mL/min (A) (by C-G formula based on SCr of 2.51 mg/dL (H)).   Assessment: 84 y.o. male admitted with AMS/SOB. On warfarin for h/o PE. Pharmacy managing IV heparin for PE and vancomycin and zosyn for r/o pneumonia.   ID: WBC 17.8>14., Tm 101.70F, SCr up to 2.51. Patient is oliguric  AC: Hep lvl therapeutic 0.68. H/H trending down, Plt wnl  Goal of Therapy:  Heparin level 0.3-0.7 units/ml Monitor platelets by anticoagulation protocol: Yes   Plan:  -Continue heparin 1200 units/hr -Decrease vancomycin to 1500 mg IV Q 48 hours to start tomorrow -Continue Zosyn 3.375 gm IV Q 8 hours (EI infusion) -daily hl cbc -Monitor renal fx, cultures and clinical progress -VT at [place],  PharmD., BCPS Clinical Pharmacist Clinical phone for 06/14/17 until 3:30pm: [phone] If after 3:30pm, please call main pharmacy at: [phone]

## 2017-06-14 NOTE — Plan of Care (Signed)
Patient maintaining oxygen saturation with ventilation.

## 2017-06-14 NOTE — Progress Notes (Signed)
PULMONARY / CRITICAL CARE MEDICINE   Name: Miguel [name] MRN: [id number] DOB: 05-26-1931    ADMISSION DATE:     HISTORY OF PRESENT ILLNESS:        84 year old male with past medical history as below, which is significant for rheumatoid arthritis, pulmonary embolism in 2015 still on anticoagulation, and depression on several medications including lithium.  He was in his usual state of health until Sunday 3/17 when he was noted by family members to be increasingly somnolent.  This somewhat improved in the following days but he developed progressive shortness of breath with associated minimally productive cough and eventually fevers.  He presented to [facility] emergency department on 3/20 with these complaints.  Immediately upon arrival to the emergency department he was noted to be febrile and profoundly hypoxemic with oxygen saturations in the 60s on room air.  He was escalated to higher flows of submental oxygen and was eventually placed on BiPAP.  Chest radiography concerning for pneumonia.  He was started on broad-spectrum antibiotics.  No known sick contacts. Laboratory evaluation significant for serum creatinine 2.02, anion gap 17, serum bicarb 15, troponin 2.56, lactic acid 8.94, and BNP 734.9.  PCCM has been asked to admit  Pressor requirements increased throughout the day on 3/21 and he developed ventilator asynchrony requiring pharmacologic paralysis.  This morning his pressor requirements are down somewhat he is currently on 25 of levo fed and 0.04 of vasopressin along with stress dose steroids.  He is sedated with fentanyl and Versed continues to be pharmacologically paralyzed with Nimbex he continues on a bicarbonate infusion and systemic dose heparin for his non-STEMI. Unfortunately yesterday's echocardiogram shows an ejection fraction of 20-25%.   PAST MEDICAL HISTORY :  He  has a past medical history of Arthritis, Cancer ([place]), Clotting disorder ([place]), and Depression.  PAST  SURGICAL HISTORY: He  has a past surgical history that includes Prostate surgery and Spine surgery.  Allergies  Allergen Reactions  . Ibuprofen   . Oxaprozin   . Phenelzine Sulfate     REACTION: if taken with certain foods or other medications can cause an allergic reaction    No current facility-administered medications on file prior to encounter.    Current Outpatient Medications on File Prior to Encounter  Medication Sig  . acetaminophen (TYLENOL) 325 MG tablet Take by mouth every 6 (six) hours as needed for mild pain.  Marland Kitchen ALPRAZolam (XANAX) 0.25 MG tablet Take 0.25 mg by mouth at bedtime as needed for sleep.  . ARIPiprazole (ABILIFY) 5 MG tablet Take 2.5 mg by mouth every other day.   . Cholecalciferol (VITAMIN D3) 2000 UNITS TABS Take by mouth daily.  Marland Kitchen lithium carbonate (ESKALITH) 450 MG CR tablet Take 225 mg by mouth daily.  . Multiple Vitamin (MULTIVITAMIN) tablet Take 1 tablet by mouth daily.  . predniSONE (DELTASONE) 5 MG tablet TAKE 1 TABLET BY MOUTH WITH FOOD OR MILK EVERY DAY  . sertraline (ZOLOFT) 100 MG tablet Take 100 mg by mouth daily.  . traMADol (ULTRAM) 50 MG tablet Take 50 mg by mouth every 8 (eight) hours as needed for moderate pain.   Marland Kitchen warfarin (COUMADIN) 5 MG tablet Take by mouth. 5 MG daily except 2.5 MG on Mondays and Fridays.  [name] 11 MG TB24 Take 11 mg by mouth daily.    FAMILY HISTORY:  His has no family status information on file.    SOCIAL HISTORY: He  reports that he has quit smoking. He quit  smokeless tobacco use about 60 years ago. He reports that he does not drink alcohol or use drugs.  REVIEW OF SYSTEMS:   Not obtainable  SUBJECTIVE:       As above  VITAL SIGNS: BP 109/81   Pulse 71   Temp 99.3 F (37.4 C)   Resp (!) 21   Ht 5\' 7"  (1.702 m)   Wt 224 lb 3.3 oz (101.7 kg)   SpO2 93%   BMI 35.12 kg/m   HEMODYNAMICS:    VENTILATOR SETTINGS: Vent Mode: PRVC FiO2 (%):  [100 %] 100 % Set Rate:  [20 bmp-28 bmp] 28 bmp Vt  Set:  [530 mL] 530 mL PEEP:  [5 cmH20-12 cmH20] 12 cmH20 Plateau Pressure:  [32 cmH20-36 cmH20] 32 cmH20  INTAKE / OUTPUT: I/O last 3 completed shifts: In: 9532.7 [I.V.:6632.7; IV XQJJHERDE:0814] Out: 550 [Urine:350; Emesis/NG output:200]  PHYSICAL EXAMINATION: General: This is an elderly male who is orally intubated mechanically ventilated deeply sedated and pharmacologically paralyzed    HEENT: Pupils are equal and reactive he has a bilateral arcus    Cardiovascular: S1 and S2 are regular and somewhat distant  without murmur rub or gallop. Abdomen:  The abdomen is obese and soft with an easily reducible small ventral hernia.  There is no obvious mass organomegaly, he is anicteric Lungs: He is pharmacologically paralyzed, there is symmetric air movement, no wheezes.  Peak inspiratory pressure was approximately 39 during my visit.  He continues to have some scattered rhonchi  Musculoskeletal: He has trace edema.     LABS:  BMET Recent Labs  Lab 05/28/2017 2255 06/08/2017 2301 06/13/17 0534 06/14/17 0500  NA 136 139 140 131*  K 3.7 3.9 3.7 3.7  CL 104 107 111 94*  CO2 15*  --  19* 20*  BUN 20 22* 22* 30*  CREATININE 2.02* 1.90* 1.85* 2.51*  GLUCOSE 276* 275* 178* 101*    Electrolytes Recent Labs  Lab 06/08/2017 2255 06/13/17 0534 06/14/17 0500  CALCIUM 8.8* 7.2* 6.2*  MG 2.1 1.5* 1.4*  PHOS  --  2.1*  --     CBC Recent Labs  Lab 06/11/2017 2255 05/31/2017 2301 06/14/17 0500  WBC 17.8*  --  14.7*  HGB 10.3* 11.6* 9.1*  HCT 33.4* 34.0* 28.6*  PLT 230  --  162    Coag's Recent Labs  Lab 06/09/2017 2255  INR 1.57    Sepsis Markers Recent Labs  Lab 06/13/17 0414 06/13/17 0828 06/13/17 1225 06/13/17 1630 06/14/17 0500  LATICACIDVEN  --  4.3* 4.4* 4.9*  --   PROCALCITON 7.09  --   --   --  73.77    ABG Recent Labs  Lab 06/13/17 1544 06/13/17 1753 06/14/17 0340  PHART 7.276* 7.362 7.330*  PCO2ART 39.6 30.6* 42.4  PO2ART 53.0* 114.0* 135*    Liver  Enzymes Recent Labs  Lab 06/23/2017 2255 06/14/17 0500  AST 50* 127*  ALT 23 65*  ALKPHOS 49 21*  BILITOT 0.7 0.9  ALBUMIN 3.6 2.4*    Cardiac Enzymes Recent Labs  Lab 06/13/17 0828 06/13/17 1630 06/13/17 2306  TROPONINI 16.58* 12.75* 13.08*    Glucose Recent Labs  Lab 06/13/17 0759 06/13/17 1224 06/13/17 1620 06/13/17 2330 06/14/17 0318  GLUCAP 145* 123* 145* 134* 120*    Imaging No results found.   STUDIES:  PCR is positive for influenza A  CULTURES: No culture results are yet reported  ANTIBIOTICS: Vancomycin Zosyn and Tamiflu started on 3/21   LINES/TUBES: Right IJ  triple-lumen placed 3/21  DISCUSSION:      This is an 84 year old who appears to have a history of rheumatoid arthritis PE in the distant past and bipolar disorder who presented with a 2-day history of dyspnea and infiltrates on chest x-ray.  He has required intubation, mechanical ventilation and pressors.    PCR is positive for influenza A.  He also has positive cardiac enzymes without any acute changes on EKG and poor LV systolic function by echo..  ASSESSMENT / PLAN:  PULMONARY A: He has developed an ARDS picture.  Unfortunately we were unable to maintain ventilator synchrony without pharmacologic paralysis.  Oxygenation is marginally improved this morning.  Peak pressures were relatively high this morning, I was not able to obtain a plateau during my visit but I am hopeful that is his pH improves will be able to back off on his tidal volume somewhat.  I do not have any culture results to guide me as to whether or not we have a secondary pneumonia nor have I seen a pro-calcitonin yet this morning.  For now I am going to continue his empiric antibiotics for potential secondary bacterial pneumonia superimposed on influenza pneumonia.  Should we be fortunate enough to get his septic picture under control we will need to deal with substantial LV dysfunction as a contributor to his respiratory  failure.   CARDIOVASCULAR A: Non-STEMI.  I added a statin yesterday but he has a reported allergy to aspirin.  He continues on low-dose heparin.  His central venous saturation is pending this morning to get a feel for the adequacy of his cardiac output.  RENAL A: It appears he has a acute insult superimposed on his CKD with a rise in creatinine from 1.8-2.5 overnight.  We are obviously doing what we can to ensure hemodynamic stability and avoid nephrotoxins.    GASTROINTESTINAL A: Prophylaxis is with Protonix     HEMATOLOGIC A: He is on full dose anticoagulation with unfractionated heparin   INFECTIOUS A: As noted above he is influenza A positive and continues on Tamiflu and is being covered for secondary infection pending culture results.   ENDOCRINE A: Insulin sliding scale insulin has been ordered     NEUROLOGIC A: He has a history of bipolar disorder.  Potentially unstable renal function I am holding his lithium for now.    Greater than 32 minutes was spent in the care of this unstable patient today.   Lars Masson, MD Pulmonary and [place] Pager: [phone]  06/14/2017, 7:08 AM

## 2017-06-14 NOTE — Progress Notes (Deleted)
As patient and pt's mother were communicating about her health and how she was doing, patient wrote to mother "kill me now." Pt's mother pulled me aside in the hallway to relay this message and ask about where pt will go after her inpatient stay. Flois Mctague, Rande Brunt, RN

## 2017-06-14 NOTE — Progress Notes (Addendum)
RT attempted to collect sputum culture not able to obtain.  RT will try again later.

## 2017-06-14 NOTE — Progress Notes (Signed)
Notified that patient had six beat run of Vtach. All of the patients other VS were WNL's. Patient is under NMB so no symptoms can be recognized. MD was notified. MD ordered a magnesium and chem8 lab draw. All labs were completed and MD was notified of results. Will continue to monitor at this time. Strip of Vtach event can be found in patients chart.

## 2017-06-14 NOTE — Progress Notes (Signed)
CRITICAL VALUE ALERT  Critical Value: Lactic Acid 5.4  Date & Time Notied: 06/14/2017 @ 0804  Provider Notified: Dr. Pearline Cables

## 2017-06-15 LAB — LACTIC ACID, PLASMA
Lactic Acid, Venous: 4.2 mmol/L (ref 0.5–1.9)
Lactic Acid, Venous: 4.3 mmol/L (ref 0.5–1.9)

## 2017-06-15 LAB — CBC WITH DIFFERENTIAL/PLATELET
Basophils Absolute: 0 10*3/uL (ref 0.0–0.1)
Basophils Relative: 0 %
EOS ABS: 0 10*3/uL (ref 0.0–0.7)
Eosinophils Relative: 0 %
HCT: 24.6 % — ABNORMAL LOW (ref 39.0–52.0)
Hemoglobin: 8.3 g/dL — ABNORMAL LOW (ref 13.0–17.0)
LYMPHS ABS: 0.5 10*3/uL — AB (ref 0.7–4.0)
LYMPHS PCT: 3 %
MCH: 31 pg (ref 26.0–34.0)
MCHC: 33.7 g/dL (ref 30.0–36.0)
MCV: 91.8 fL (ref 78.0–100.0)
MONO ABS: 0.2 10*3/uL (ref 0.1–1.0)
Monocytes Relative: 1 %
NEUTROS ABS: 16.6 10*3/uL — AB (ref 1.7–7.7)
Neutrophils Relative %: 96 %
PLATELETS: 109 10*3/uL — AB (ref 150–400)
RBC: 2.68 MIL/uL — ABNORMAL LOW (ref 4.22–5.81)
RDW: 14.5 % (ref 11.5–15.5)
WBC: 17.3 10*3/uL — ABNORMAL HIGH (ref 4.0–10.5)

## 2017-06-15 LAB — POCT I-STAT 3, ART BLOOD GAS (G3+)
ACID-BASE EXCESS: 2 mmol/L (ref 0.0–2.0)
Acid-Base Excess: 1 mmol/L (ref 0.0–2.0)
BICARBONATE: 25.7 mmol/L (ref 20.0–28.0)
BICARBONATE: 24.7 mmol/L (ref 20.0–28.0)
Bicarbonate: 25.6 mmol/L (ref 20.0–28.0)
Bicarbonate: 25.8 mmol/L (ref 20.0–28.0)
O2 SAT: 99 %
O2 Saturation: 96 %
O2 Saturation: 96 %
O2 Saturation: 97 %
PCO2 ART: 44.4 mmHg (ref 32.0–48.0)
PH ART: 7.458 — AB (ref 7.350–7.450)
PH ART: 7.416 (ref 7.350–7.450)
PH ART: 7.375 (ref 7.350–7.450)
PO2 ART: 134 mmHg — AB (ref 83.0–108.0)
Patient temperature: 100.1
TCO2: 26 mmol/L (ref 22–32)
TCO2: 27 mmol/L (ref 22–32)
TCO2: 27 mmol/L (ref 22–32)
TCO2: 27 mmol/L (ref 22–32)
pCO2 arterial: 36.5 mmHg (ref 32.0–48.0)
pCO2 arterial: 40 mmHg (ref 32.0–48.0)
pCO2 arterial: 42.6 mmHg (ref 32.0–48.0)
pH, Arterial: 7.373 (ref 7.350–7.450)
pO2, Arterial: 84 mmHg (ref 83.0–108.0)
pO2, Arterial: 86 mmHg (ref 83.0–108.0)
pO2, Arterial: 95 mmHg (ref 83.0–108.0)

## 2017-06-15 LAB — BLOOD GAS, ARTERIAL
Acid-Base Excess: 1.9 mmol/L (ref 0.0–2.0)
Bicarbonate: 26.3 mmol/L (ref 20.0–28.0)
Drawn by: 398991
FIO2: 70
MECHVT: 470 mL
O2 Saturation: 95.3 %
PCO2 ART: 44.3 mmHg (ref 32.0–48.0)
PEEP: 12 cmH2O
PO2 ART: 82.9 mmHg — AB (ref 83.0–108.0)
Patient temperature: 99
RATE: 23 resp/min
pH, Arterial: 7.392 (ref 7.350–7.450)

## 2017-06-15 LAB — GLUCOSE, CAPILLARY
GLUCOSE-CAPILLARY: 134 mg/dL — AB (ref 65–99)
GLUCOSE-CAPILLARY: 161 mg/dL — AB (ref 65–99)
GLUCOSE-CAPILLARY: 171 mg/dL — AB (ref 65–99)
Glucose-Capillary: 146 mg/dL — ABNORMAL HIGH (ref 65–99)
Glucose-Capillary: 168 mg/dL — ABNORMAL HIGH (ref 65–99)
Glucose-Capillary: 172 mg/dL — ABNORMAL HIGH (ref 65–99)

## 2017-06-15 LAB — HEPARIN LEVEL (UNFRACTIONATED): HEPARIN UNFRACTIONATED: 0.35 [IU]/mL (ref 0.30–0.70)

## 2017-06-15 LAB — COOXEMETRY PANEL
Carboxyhemoglobin: 1 % (ref 0.5–1.5)
Methemoglobin: 1.2 % (ref 0.0–1.5)
O2 Saturation: 78.1 %
TOTAL HEMOGLOBIN: 7.6 g/dL — AB (ref 12.0–16.0)

## 2017-06-15 LAB — CALCIUM, IONIZED: Calcium, Ionized, Serum: 3.5 mg/dL — ABNORMAL LOW (ref 4.5–5.6)

## 2017-06-15 LAB — BASIC METABOLIC PANEL
Anion gap: 15 (ref 5–15)
BUN: 35 mg/dL — AB (ref 6–20)
CALCIUM: 5.8 mg/dL — AB (ref 8.9–10.3)
CO2: 24 mmol/L (ref 22–32)
CREATININE: 2.34 mg/dL — AB (ref 0.61–1.24)
Chloride: 87 mmol/L — ABNORMAL LOW (ref 101–111)
GFR calc Af Amer: 28 mL/min — ABNORMAL LOW (ref >60)
GFR calc non Af Amer: 24 mL/min — ABNORMAL LOW (ref >60)
Glucose, Bld: 170 mg/dL — ABNORMAL HIGH (ref 65–99)
Potassium: 3.5 mmol/L (ref 3.5–5.1)
SODIUM: 126 mmol/L — AB (ref 135–145)

## 2017-06-15 LAB — MAGNESIUM: Magnesium: 1.8 mg/dL (ref 1.7–2.4)

## 2017-06-15 LAB — PROCALCITONIN: PROCALCITONIN: 70.2 ng/mL

## 2017-06-15 LAB — PHOSPHORUS: Phosphorus: 4.4 mg/dL (ref 2.5–4.6)

## 2017-06-15 MED ORDER — POTASSIUM CHLORIDE 10 MEQ/100ML IV SOLN
10.0000 meq | INTRAVENOUS | Status: AC
  Administered 2017-06-15 – 2017-06-16 (×3): 10 meq via INTRAVENOUS
  Filled 2017-06-15 (×3): qty 100

## 2017-06-15 MED ORDER — ASPIRIN 325 MG PO TABS
325.0000 mg | ORAL_TABLET | Freq: Every day | ORAL | Status: DC
  Administered 2017-06-15 – 2017-06-16 (×2): 325 mg
  Filled 2017-06-15 (×3): qty 1

## 2017-06-15 MED ORDER — MAGNESIUM SULFATE 2 GM/50ML IV SOLN
2.0000 g | Freq: Once | INTRAVENOUS | Status: AC
  Administered 2017-06-15: 2 g via INTRAVENOUS
  Filled 2017-06-15: qty 50

## 2017-06-15 MED ORDER — FUROSEMIDE 10 MG/ML IJ SOLN
60.0000 mg | Freq: Four times a day (QID) | INTRAMUSCULAR | Status: DC
Start: 2017-06-15 — End: 2017-06-15
  Administered 2017-06-15: 60 mg via INTRAVENOUS
  Filled 2017-06-15: qty 6

## 2017-06-15 MED ORDER — NOREPINEPHRINE BITARTRATE 1 MG/ML IV SOLN
0.0000 ug/min | INTRAVENOUS | Status: DC
  Administered 2017-06-15: 22 ug/min via INTRAVENOUS
  Administered 2017-06-16: 21 ug/min via INTRAVENOUS
  Filled 2017-06-15 (×4): qty 16

## 2017-06-15 MED ORDER — ORAL CARE MOUTH RINSE
15.0000 mL | OROMUCOSAL | Status: DC
  Administered 2017-06-15 – 2017-06-17 (×25): 15 mL via OROMUCOSAL

## 2017-06-15 MED ORDER — TOBRAMYCIN 300 MG/5ML IN NEBU
300.0000 mg | INHALATION_SOLUTION | Freq: Two times a day (BID) | RESPIRATORY_TRACT | Status: DC
  Administered 2017-06-15 – 2017-06-17 (×4): 300 mg via RESPIRATORY_TRACT
  Filled 2017-06-15 (×6): qty 5

## 2017-06-15 MED ORDER — CHLORHEXIDINE GLUCONATE 0.12% ORAL RINSE (MEDLINE KIT)
15.0000 mL | Freq: Two times a day (BID) | OROMUCOSAL | Status: DC
  Administered 2017-06-15 – 2017-06-16 (×3): 15 mL via OROMUCOSAL

## 2017-06-15 MED ORDER — SODIUM CHLORIDE 0.9 % IV BOLUS (SEPSIS)
1000.0000 mL | Freq: Once | INTRAVENOUS | Status: AC
  Administered 2017-06-15: 1000 mL via INTRAVENOUS

## 2017-06-15 MED ORDER — MEROPENEM 1 G IV SOLR
1.0000 g | Freq: Two times a day (BID) | INTRAVENOUS | Status: DC
Start: 2017-06-15 — End: 2017-06-18
  Administered 2017-06-15 – 2017-06-17 (×4): 1 g via INTRAVENOUS
  Filled 2017-06-15 (×5): qty 1

## 2017-06-15 MED ORDER — FONDAPARINUX SODIUM 2.5 MG/0.5ML ~~LOC~~ SOLN: 2.5000 mg | Freq: Every day | SUBCUTANEOUS | Status: DC

## 2017-06-15 MED ORDER — SODIUM CHLORIDE 0.9 % IV SOLN
1.0000 g | Freq: Once | INTRAVENOUS | Status: DC
  Filled 2017-06-15: qty 10

## 2017-06-15 MED ORDER — FUROSEMIDE 10 MG/ML IJ SOLN
40.0000 mg | Freq: Once | INTRAMUSCULAR | Status: AC
  Administered 2017-06-15: 40 mg via INTRAVENOUS
  Filled 2017-06-15: qty 4

## 2017-06-15 MED ORDER — SODIUM CHLORIDE 0.9 % IV SOLN
1.0000 g | Freq: Once | INTRAVENOUS | Status: AC
  Administered 2017-06-15: 1 g via INTRAVENOUS
  Filled 2017-06-15: qty 10

## 2017-06-15 NOTE — Progress Notes (Signed)
RT attempted to get sputum culture again.  Unable to get sample.  Left in line, will attempt later.

## 2017-06-15 NOTE — Progress Notes (Signed)
[facility] Progress Note Patient Name: Miguel [name] DOB: 09-22-1931 MRN: [id number]   Date of Service  06/15/2017  HPI/Events of Note  Low potassium and magnesium  eICU Interventions  replaced     Intervention Category Minor Interventions: Electrolytes abnormality - evaluation and management  Mauri Brooklyn, P 06/15/2017, 5:55 AM

## 2017-06-15 NOTE — Progress Notes (Signed)
Patient remained on NMB throughout the night. Patient's SaO2 dropped into the 80's but quickly bounced back up. Patient experienced various runs of Vtach with the greatest being 13. Elink consulted. Dr. Tamala Julian ordered to monitor and titrate down on the Levophed. Critical labs received of Ca+5.8 and Lactic Acid 4.2. Abby at [facility] notified and she will let the MD know of the results.

## 2017-06-15 NOTE — Progress Notes (Signed)
PULMONARY / CRITICAL CARE MEDICINE   Name: RAMI BUDHU MRN: [id number] DOB: Feb 09, 1932    ADMISSION DATE:     HISTORY OF PRESENT ILLNESS:        84 year old male with past medical history as below, which is significant for rheumatoid arthritis, pulmonary embolism in 2015 still on anticoagulation, and depression on several medications including lithium.  He was in his usual state of health until Sunday 3/17 when he was noted by family members to be increasingly somnolent.  This somewhat improved in the following days but he developed progressive shortness of breath with associated minimally productive cough and eventually fevers.  He presented to [facility] emergency department on 3/20 with these complaints.  Immediately upon arrival to the emergency department he was noted to be febrile and profoundly hypoxemic with oxygen saturations in the 60s on room air.  He was escalated to higher flows of submental oxygen and was eventually placed on BiPAP.  Chest radiography concerning for pneumonia.  He was started on broad-spectrum antibiotics.  No known sick contacts. Laboratory evaluation significant for serum creatinine 2.02, anion gap 17, serum bicarb 15, troponin 2.56, lactic acid 8.94, and BNP 734.9.  PCCM has been asked to admit  Pressor requirements increased throughout the day on 3/21 and he developed ventilator asynchrony requiring pharmacologic paralysis.     He had a brief run of ventricular tachycardia yesterday afternoon which was associated with a very low potassium.  He remains intubated and mechanically ventilated and pharmacologically paralyzed to maintain ventilator synchrony.  He is oliguric and was positive almost 5 L yesterday.  He continues to require pressors and is now on vasopressin, stress dose steroids, and 23 mcg of levo fed. Unfortunately echocardiogram on 3/21 shows an ejection fraction of 20-25%.   PAST MEDICAL HISTORY :  He  has a past medical history of Arthritis,  Cancer ([place]), Clotting disorder ([place]), and Depression.  PAST SURGICAL HISTORY: He  has a past surgical history that includes Prostate surgery and Spine surgery.  Allergies  Allergen Reactions  . Ibuprofen   . Oxaprozin   . Phenelzine Sulfate     REACTION: if taken with certain foods or other medications can cause an allergic reaction    No current facility-administered medications on file prior to encounter.    Current Outpatient Medications on File Prior to Encounter  Medication Sig  . acetaminophen (TYLENOL) 325 MG tablet Take by mouth every 6 (six) hours as needed for mild pain.  Marland Kitchen ALPRAZolam (XANAX) 0.25 MG tablet Take 0.25 mg by mouth at bedtime as needed for sleep.  . ARIPiprazole (ABILIFY) 5 MG tablet Take 2.5 mg by mouth every other day.   . Cholecalciferol (VITAMIN D3) 2000 UNITS TABS Take by mouth daily.  Marland Kitchen lithium carbonate (ESKALITH) 450 MG CR tablet Take 225 mg by mouth daily.  . Multiple Vitamin (MULTIVITAMIN) tablet Take 1 tablet by mouth daily.  . predniSONE (DELTASONE) 5 MG tablet TAKE 1 TABLET BY MOUTH WITH FOOD OR MILK EVERY DAY  . sertraline (ZOLOFT) 100 MG tablet Take 100 mg by mouth daily.  . traMADol (ULTRAM) 50 MG tablet Take 50 mg by mouth every 8 (eight) hours as needed for moderate pain.   Marland Kitchen warfarin (COUMADIN) 5 MG tablet Take by mouth. 5 MG daily except 2.5 MG on Mondays and Fridays.  [name] 11 MG TB24 Take 11 mg by mouth daily.    FAMILY HISTORY:  His has no family status information on file.  SOCIAL HISTORY: He  reports that he has quit smoking. He quit smokeless tobacco use about 60 years ago. He reports that he does not drink alcohol or use drugs.  REVIEW OF SYSTEMS:   Not obtainable  SUBJECTIVE:       As above  VITAL SIGNS: BP (!) 112/102   Pulse 82   Temp (!) 100.9 F (38.3 C)   Resp (!) 23   Ht 5\' 7"  (1.702 m)   Wt 224 lb 3.3 oz (101.7 kg)   SpO2 98%   BMI 35.12 kg/m   HEMODYNAMICS:    VENTILATOR SETTINGS: Vent  Mode: PRVC FiO2 (%):  [80 %-100 %] 80 % Set Rate:  [23 bmp-28 bmp] 23 bmp Vt Set:  [500 mL-530 mL] 500 mL PEEP:  [12 cmH20] 12 cmH20 Plateau Pressure:  [32 cmH20-36 cmH20] 34 cmH20  INTAKE / OUTPUT: I/O last 3 completed shifts: In: 8167.9 [I.V.:7351.6; NG/GT:106.3; IV Piggyback:710] Out: 48 [Urine:350; Emesis/NG output:230]  PHYSICAL EXAMINATION: General: This is an elderly male who is orally intubated and mechanically ventilated.  He is deeply sedated and pharmacologically paralyzed.     HEENT: Pupils are pinpoint and equal, he has a bilateral arcus    Cardiovascular: S1 and S2 are regular and somewhat distant  without murmur rub or gallop. Abdomen:  The abdomen is obese and soft with an easily reducible small ventral hernia.  There is no obvious mass organomegaly, he is anicteric Lungs: He is pharmacologically paralyzed, there is symmetric air movement, no wheezes.  Peak expiratory pressures remained in the high 30s this morning.  He continues to have some scattered rhonchi  Musculoskeletal: He has trace edema.     LABS:  BMET Recent Labs  Lab 06/14/17 0500 06/14/17 1519 06/14/17 1525 06/15/17 0449  NA 131* 129* 130* 126*  K 3.7 3.3* 3.3* 3.5  CL 94* 90* 91* 87*  CO2 20*  --  23 24  BUN 30* 28* 31* 35*  CREATININE 2.51* 2.10* 2.28* 2.34*  GLUCOSE 101* 136* 144* 170*    Electrolytes Recent Labs  Lab 06/13/17 0534 06/14/17 0500 06/14/17 1525 06/15/17 0449  CALCIUM 7.2* 6.2* 5.9* 5.8*  MG 1.5* 1.4* 1.5* 1.8  PHOS 2.1*  --   --  4.4    CBC Recent Labs  Lab 06/14/2017 2255  06/14/17 0500 06/14/17 1519 06/15/17 0449  WBC 17.8*  --  14.7*  --  17.3*  HGB 10.3*   < > 9.1* 7.8* 8.3*  HCT 33.4*   < > 28.6* 23.0* 24.6*  PLT 230  --  162  --  109*   < > = values in this interval not displayed.    Coag's Recent Labs  Lab 05/28/2017 2255  INR 1.57    Sepsis Markers Recent Labs  Lab 06/13/17 0414  06/14/17 0500 06/14/17 0804 06/14/17 0958 06/15/17 0449   LATICACIDVEN  --    < >  --  5.4* 5.3* 4.2*  PROCALCITON 7.09  --  73.77  --   --  70.20   < > = values in this interval not displayed.    ABG Recent Labs  Lab 06/14/17 1514 06/14/17 1838 06/15/17 0344  PHART 7.493* 7.434 7.458*  PCO2ART 29.8* 37.0 36.5  PO2ART 131.0* 84.0 134.0*    Liver Enzymes Recent Labs  Lab 06/11/2017 2255 06/14/17 0500  AST 50* 127*  ALT 23 65*  ALKPHOS 49 21*  BILITOT 0.7 0.9  ALBUMIN 3.6 2.4*    Cardiac Enzymes Recent Labs  Lab 06/13/17 0828 06/13/17 1630 06/13/17 2306  TROPONINI 16.58* 12.75* 13.08*    Glucose Recent Labs  Lab 06/14/17 0823 06/14/17 1206 06/14/17 1556 06/14/17 2026 06/14/17 2338 06/15/17 0321  GLUCAP 85 104* 105* 122* 151* 146*    Imaging No results found.   STUDIES:  PCR is positive for influenza A  CULTURES: Blood cultures from admission are negative so far, there is no report back yet on sputum  ANTIBIOTICS: Vancomycin Zosyn and Tamiflu started on 3/21   LINES/TUBES: Right IJ triple-lumen placed 3/21  DISCUSSION:      This is an 84 year old who appears to have a history of rheumatoid arthritis,  PE in the distant past,  and bipolar disorder who presented with a 2-day history of dyspnea and infiltrates on chest x-ray.  He has required intubation, mechanical ventilation and pressors.    PCR is positive for influenza A.  He also has positive cardiac enzymes without any acute changes on EKG and poor LV systolic function by echo..  ASSESSMENT / PLAN:  PULMONARY A: He has developed an ARDS picture.  Oxygenation is marginally improved this morning but he is still pharmacologically paralyzed to maintain ventilator synchrony.  He is a little alkalotic and I have decreased his tidal volume to pursue a strategy more consistent with ARDS net protocol. He continues Tamiflu with vancomycin and Zosyn for likely superimposed bacterial infection.  CARDIOVASCULAR A: Non-STEMI.  I added a statin yesterday but he  has a reported allergy to aspirin.  Afrin was discontinued this morning due to a dropping platelet count.  He has a persistent lactic acidosis and oliguria this morning, whether this is secondary to sepsis or poor cardiac output is not overt, I will be obtaining a central venous saturation later in the day to sort this out.  RENAL A: It appears he has a acute insult superimposed on his CKD with a rise in creatinine from 1.8-2.5.  Has remained relatively stable overnight but he is still oliguric.  I am going to give him a single dose of Lasix to see if I can convert him to a nonoliguric state.     GASTROINTESTINAL A: Prophylaxis is with Protonix     HEMATOLOGIC A: He was on full dose unfractionated heparin this morning due to the presence of a non-STEMI however his platelets have dropped substantially since admission.  It is too early to be considering hit illness he has prior exposure and he certainly another reason in the form of sepsis to be dropping his platelet count.  Although he is low suspicion, I have discontinued the full dose unfractionated heparin.  His creatinine clearance at present is preventing me from utilizing Arixtra or a NOAC for DVT prophylaxis.    INFECTIOUS A: As noted above he is influenza A positive and continues on Tamiflu and is being covered for secondary infection pending culture results.  Blood cultures obtained at admission are so far negative and we do not have a sputum culture back yet.  Pro-calcitonin remains elevated however his clearance is very poor and he otherwise appears to be somewhat improved with a decreasing pressor requirement.  I will not make empiric changes in antibiotics today continuing the vancomycin and Zosyn for secondary infection superimposed on influenza pneumonia   ENDOCRINE A: Insulin sliding scale insulin has been ordered     NEUROLOGIC A: He has a history of bipolar disorder, with potentially unstable renal function I am holding his  lithium for now.    Greater than  32 minutes was spent in the care of this unstable patient today.   Lars Masson, MD Pulmonary and [place] Pager: [phone]  06/15/2017, 7:08 AM

## 2017-06-15 NOTE — Progress Notes (Signed)
Patient with hx Afib, CKD, RA, DVT/PE, admitted with Flu A, Pneumonia, Septic shock, VDRF, AKI-on-CKD. Remains on 2 vasopressors, stress dose steroids, and has low UOP. Feel is still intravascularly dry so will stop the scheduled Lasix. Given 1L NS bolus now. Monitor for responses in UOP or BP. Regarding his ARDS, ABG is adequate and certainly doesn't show any metabolic acidosis, so will stop bicarb gtt now. Continue ABG and CXR daily. Was on Heparin gtt for his Afib as INR was subtherapeutic on his coumadin. Now developing thrombocytopenia which is likely secondary to the sepsis itself as well as side effect of the zosyn he had been on. However out of concern for possible HIT, the heparin is now being held. Heparin antibody pending. Will send Serotonin release assay as well. Antibiotics have since been changed from Zosyn to Vancomycin and Meropenem. Patient is also receiving Tobi nebs but not sure why. Will defer to day-team. Sputum culture hasnt been collected yet but RT to try again tonight. Hypocalcemia and Hypomagnesemia both repleted today. Hypokalemia not repleted; in setting of AKI will replete but very gently, caution not to overcorrect. Patient also has NSTEMI with wall motion abnormality on TTE. Question if doesn't need some time of anticoag and if concern for HIT may switch to Argatroban. Defer to day-team.

## 2017-06-16 ENCOUNTER — Inpatient Hospital Stay (HOSPITAL_COMMUNITY): Payer: Medicare Other

## 2017-06-16 ENCOUNTER — Other Ambulatory Visit: Payer: Self-pay

## 2017-06-16 LAB — CBC WITH DIFFERENTIAL/PLATELET
BAND NEUTROPHILS: 0 %
BASOS ABS: 0 10*3/uL (ref 0.0–0.1)
BASOS PCT: 0 %
Blasts: 0 %
EOS ABS: 0 10*3/uL (ref 0.0–0.7)
Eosinophils Relative: 0 %
HCT: 23.3 % — ABNORMAL LOW (ref 39.0–52.0)
HEMOGLOBIN: 7.8 g/dL — AB (ref 13.0–17.0)
Lymphocytes Relative: 3 %
Lymphs Abs: 0.4 10*3/uL — ABNORMAL LOW (ref 0.7–4.0)
MCH: 31.3 pg (ref 26.0–34.0)
MCHC: 33.5 g/dL (ref 30.0–36.0)
MCV: 93.6 fL (ref 78.0–100.0)
METAMYELOCYTES PCT: 0 %
MONO ABS: 0.3 10*3/uL (ref 0.1–1.0)
MYELOCYTES: 0 %
Monocytes Relative: 2 %
Neutro Abs: 13.2 10*3/uL — ABNORMAL HIGH (ref 1.7–7.7)
Neutrophils Relative %: 95 %
OTHER: 0 %
PROMYELOCYTES ABS: 0 %
Platelets: 97 10*3/uL — ABNORMAL LOW (ref 150–400)
RBC: 2.49 MIL/uL — ABNORMAL LOW (ref 4.22–5.81)
RDW: 14.7 % (ref 11.5–15.5)
WBC: 13.9 10*3/uL — ABNORMAL HIGH (ref 4.0–10.5)
nRBC: 0 /100 WBC

## 2017-06-16 LAB — CBC
HEMATOCRIT: 22.4 % — AB (ref 39.0–52.0)
Hemoglobin: 7.6 g/dL — ABNORMAL LOW (ref 13.0–17.0)
MCH: 31.8 pg (ref 26.0–34.0)
MCHC: 33.9 g/dL (ref 30.0–36.0)
MCV: 93.7 fL (ref 78.0–100.0)
PLATELETS: 85 10*3/uL — AB (ref 150–400)
RBC: 2.39 MIL/uL — ABNORMAL LOW (ref 4.22–5.81)
RDW: 14.8 % (ref 11.5–15.5)
WBC: 12 10*3/uL — AB (ref 4.0–10.5)

## 2017-06-16 LAB — POCT I-STAT 3, ART BLOOD GAS (G3+)
Bicarbonate: 25.5 mmol/L (ref 20.0–28.0)
O2 Saturation: 95 %
PH ART: 7.372 (ref 7.350–7.450)
PO2 ART: 78 mmHg — AB (ref 83.0–108.0)
TCO2: 27 mmol/L (ref 22–32)
pCO2 arterial: 43.8 mmHg (ref 32.0–48.0)

## 2017-06-16 LAB — OCCULT BLOOD X 1 CARD TO LAB, STOOL: FECAL OCCULT BLD: POSITIVE — AB

## 2017-06-16 LAB — GLUCOSE, CAPILLARY
GLUCOSE-CAPILLARY: 158 mg/dL — AB (ref 65–99)
GLUCOSE-CAPILLARY: 133 mg/dL — AB (ref 65–99)
GLUCOSE-CAPILLARY: 131 mg/dL — AB (ref 65–99)
GLUCOSE-CAPILLARY: 132 mg/dL — AB (ref 65–99)
GLUCOSE-CAPILLARY: 120 mg/dL — AB (ref 65–99)

## 2017-06-16 LAB — COMPREHENSIVE METABOLIC PANEL
ALBUMIN: 1.8 g/dL — AB (ref 3.5–5.0)
ALT: 134 U/L — AB (ref 17–63)
AST: 193 U/L — ABNORMAL HIGH (ref 15–41)
Alkaline Phosphatase: 45 U/L (ref 38–126)
Anion gap: 13 (ref 5–15)
BILIRUBIN TOTAL: 1.8 mg/dL — AB (ref 0.3–1.2)
BUN: 43 mg/dL — AB (ref 6–20)
CO2: 23 mmol/L (ref 22–32)
CREATININE: 2.35 mg/dL — AB (ref 0.61–1.24)
Calcium: 6 mg/dL — CL (ref 8.9–10.3)
Chloride: 91 mmol/L — ABNORMAL LOW (ref 101–111)
GFR calc Af Amer: 28 mL/min — ABNORMAL LOW (ref >60)
GFR calc non Af Amer: 24 mL/min — ABNORMAL LOW (ref >60)
GLUCOSE: 158 mg/dL — AB (ref 65–99)
Potassium: 3.3 mmol/L — ABNORMAL LOW (ref 3.5–5.1)
SODIUM: 127 mmol/L — AB (ref 135–145)
Total Protein: 4.7 g/dL — ABNORMAL LOW (ref 6.5–8.1)

## 2017-06-16 LAB — LACTIC ACID, PLASMA
LACTIC ACID, VENOUS: 1.8 mmol/L (ref 0.5–1.9)
Lactic Acid, Venous: 1.7 mmol/L (ref 0.5–1.9)

## 2017-06-16 LAB — PROCALCITONIN: Procalcitonin: 42.85 ng/mL

## 2017-06-16 LAB — HEPARIN INDUCED PLATELET AB (HIT ANTIBODY): Heparin Induced Plt Ab: 0.127 OD (ref 0.000–0.400)

## 2017-06-16 MED ORDER — POTASSIUM CHLORIDE 10 MEQ/100ML IV SOLN
10.0000 meq | INTRAVENOUS | Status: AC
  Administered 2017-06-16 (×3): 10 meq via INTRAVENOUS
  Filled 2017-06-16 (×3): qty 100

## 2017-06-16 MED ORDER — SODIUM CHLORIDE 0.9 % IV BOLUS (SEPSIS)
1000.0000 mL | Freq: Once | INTRAVENOUS | Status: AC
  Administered 2017-06-16: 1000 mL via INTRAVENOUS

## 2017-06-16 MED ORDER — ALBUMIN HUMAN 5 % IV SOLN
12.5000 g | Freq: Once | INTRAVENOUS | Status: AC
  Administered 2017-06-16: 12.5 g via INTRAVENOUS
  Filled 2017-06-16: qty 250

## 2017-06-16 MED ORDER — SODIUM CHLORIDE 0.9 % IV SOLN
1.0000 g | Freq: Once | INTRAVENOUS | Status: AC
  Administered 2017-06-16: 1 g via INTRAVENOUS
  Filled 2017-06-16: qty 10

## 2017-06-16 MED ORDER — SODIUM CHLORIDE 0.9 % IV SOLN
INTRAVENOUS | Status: DC
  Administered 2017-06-16: 02:00:00 via INTRAVENOUS

## 2017-06-16 MED ORDER — FAMOTIDINE 40 MG/5ML PO SUSR
20.0000 mg | Freq: Every day | ORAL | Status: DC
  Administered 2017-06-16: 20 mg
  Filled 2017-06-16 (×2): qty 2.5

## 2017-06-16 MED ORDER — DOCUSATE SODIUM 50 MG/5ML PO LIQD
100.0000 mg | Freq: Two times a day (BID) | ORAL | Status: DC
  Administered 2017-06-16: 100 mg
  Filled 2017-06-16: qty 10

## 2017-06-16 NOTE — Progress Notes (Signed)
PULMONARY / CRITICAL CARE MEDICINE   Name: Miguel [name] MRN: [id number] DOB: Oct 18, 1931    ADMISSION DATE:     HISTORY OF PRESENT ILLNESS:        84 year old male with past medical history as below, which is significant for rheumatoid arthritis, pulmonary embolism in 2015 still on anticoagulation, and depression on several medications including lithium.  He was in his usual state of health until Sunday 3/17 when he was noted by family members to be increasingly somnolent.  This somewhat improved in the following days but he developed progressive shortness of breath with associated minimally productive cough and eventually fevers.  He presented to [facility] emergency department on 3/20 with these complaints.  Immediately upon arrival to the emergency department he was noted to be febrile and profoundly hypoxemic with oxygen saturations in the 60s on room air.  He was escalated to higher flows of submental oxygen and was eventually placed on BiPAP.  Chest radiography concerning for pneumonia.  He was started on broad-spectrum antibiotics.  No known sick contacts. Laboratory evaluation significant for serum creatinine 2.02, anion gap 17, serum bicarb 15, troponin 2.56, lactic acid 8.94, and BNP 734.9.  PCCM has been asked to admit  Pressor requirements increased throughout the day on 3/21 and he developed ventilator asynchrony requiring pharmacologic paralysis.  He was given several fluid boluses overnight and his urine output is increased although he remains marginally oliguric with a total urine output of 720 cc in the past 24 hours.  He remains sedated and pharmacologically paralyzed.  Vasopressin has been weaned to off and he is currently on 20 mcg of levo fed for blood pressure support along with stress dose steroids.    Unfortunately echocardiogram on 3/21 shows an ejection fraction of 25-30%    PAST MEDICAL HISTORY :  He  has a past medical history of Arthritis, Cancer ([place]), Clotting  disorder ([place]), and Depression.  PAST SURGICAL HISTORY: He  has a past surgical history that includes Prostate surgery and Spine surgery.  Allergies  Allergen Reactions  . Ibuprofen   . Oxaprozin   . Phenelzine Sulfate     REACTION: if taken with certain foods or other medications can cause an allergic reaction    No current facility-administered medications on file prior to encounter.    Current Outpatient Medications on File Prior to Encounter  Medication Sig  . acetaminophen (TYLENOL) 325 MG tablet Take by mouth every 6 (six) hours as needed for mild pain.  Marland Kitchen ALPRAZolam (XANAX) 0.25 MG tablet Take 0.25 mg by mouth at bedtime as needed for sleep.  . ARIPiprazole (ABILIFY) 5 MG tablet Take 2.5 mg by mouth every other day.   . Cholecalciferol (VITAMIN D3) 2000 UNITS TABS Take by mouth daily.  Marland Kitchen lithium carbonate (ESKALITH) 450 MG CR tablet Take 225 mg by mouth daily.  . Multiple Vitamin (MULTIVITAMIN) tablet Take 1 tablet by mouth daily.  . predniSONE (DELTASONE) 5 MG tablet TAKE 1 TABLET BY MOUTH WITH FOOD OR MILK EVERY DAY  . sertraline (ZOLOFT) 100 MG tablet Take 100 mg by mouth daily.  . traMADol (ULTRAM) 50 MG tablet Take 50 mg by mouth every 8 (eight) hours as needed for moderate pain.   Marland Kitchen warfarin (COUMADIN) 5 MG tablet Take by mouth. 5 MG daily except 2.5 MG on Mondays and Fridays.  [name] 11 MG TB24 Take 11 mg by mouth daily.    FAMILY HISTORY:  His has no family status information on  file.    SOCIAL HISTORY: He  reports that he has quit smoking. He quit smokeless tobacco use about 60 years ago. He reports that he does not drink alcohol or use drugs.  REVIEW OF SYSTEMS:   Not obtainable  SUBJECTIVE:       As above  VITAL SIGNS: BP 120/69   Pulse (!) 59   Temp 98.2 F (36.8 C)   Resp (!) 23   Ht 5\' 7"  (1.702 m)   Wt 259 lb 7.7 oz (117.7 kg)   SpO2 92%   BMI 40.64 kg/m   HEMODYNAMICS:    VENTILATOR SETTINGS: Vent Mode: PRVC FiO2 (%):  [60 %-70  %] 60 % Set Rate:  [23 bmp] 23 bmp Vt Set:  [470 mL] 470 mL PEEP:  [12 cmH20] 12 cmH20 Plateau Pressure:  [34 cmH20-42 cmH20] 38 cmH20  INTAKE / OUTPUT: I/O last 3 completed shifts: In: 9647.3 [I.V.:7847.3; NG/GT:700; IV Piggyback:1100] Out: 65 [Urine:1130]  PHYSICAL EXAMINATION: General: This is an elderly intubated male who is sedated and mechanically ventilated.  He is developing overt anasarca.       HEENT: Pupils are pinpoint and equal, he has a bilateral arcus.    Cardiovascular: S1 and S2 are regular with an occasional ectopic without murmur rub or gallop.   Abdomen: The abdomen is obese and soft without any overt masses.  Tenderness cannot be judged as the patient is pharmacologically paralyzed. Lungs: He is pharmacologically paralyzed, there is symmetric air movement, no wheezes.  musculoskletal: He has 1+ anasarca with scrotal edema    LABS:  BMET Recent Labs  Lab 06/14/17 1525 06/15/17 0449 06/16/17 0445  NA 130* 126* 127*  K 3.3* 3.5 3.3*  CL 91* 87* 91*  CO2 23 24 23   BUN 31* 35* 43*  CREATININE 2.28* 2.34* 2.35*  GLUCOSE 144* 170* 158*    Electrolytes Recent Labs  Lab 06/13/17 0534 06/14/17 0500 06/14/17 1525 06/15/17 0449 06/16/17 0445  CALCIUM 7.2* 6.2* 5.9* 5.8* 6.0*  MG 1.5* 1.4* 1.5* 1.8  --   PHOS 2.1*  --   --  4.4  --     CBC Recent Labs  Lab 06/14/17 0500 06/14/17 1519 06/15/17 0449 06/16/17 0445  WBC 14.7*  --  17.3* 13.9*  HGB 9.1* 7.8* 8.3* 7.8*  HCT 28.6* 23.0* 24.6* 23.3*  PLT 162  --  109* 97*    Coag's Recent Labs  Lab 06/14/2017 2255  INR 1.57    Sepsis Markers Recent Labs  Lab 06/13/17 0414  06/14/17 0500  06/14/17 0958 06/15/17 0449 06/15/17 1358  LATICACIDVEN  --    < >  --    < > 5.3* 4.2* 4.3*  PROCALCITON 7.09  --  73.77  --   --  70.20  --    < > = values in this interval not displayed.    ABG Recent Labs  Lab 06/15/17 1504 06/15/17 2343 06/16/17 0344  PHART 7.375 7.373 7.372  PCO2ART 42.6  44.4 43.8  PO2ART 86.0 95.0 78.0*    Liver Enzymes Recent Labs  Lab 05/29/2017 2255 06/14/17 0500 06/16/17 0445  AST 50* 127* 193*  ALT 23 65* 134*  ALKPHOS 49 21* 45  BILITOT 0.7 0.9 1.8*  ALBUMIN 3.6 2.4* 1.8*    Cardiac Enzymes Recent Labs  Lab 06/13/17 0828 06/13/17 1630 06/13/17 2306  TROPONINI 16.58* 12.75* 13.08*    Glucose Recent Labs  Lab 06/15/17 0709 06/15/17 1112 06/15/17 1512 06/15/17 1950 06/15/17 2330 06/16/17 2671  GLUCAP 134* 168* 172* 171* 161* 158*    Imaging No results found.   STUDIES:  PCR is positive for influenza A  CULTURES: Cultures from admission remain negative, we still have no sputum culture back.   ANTIBIOTICS: Vancomycin Zosyn and Tamiflu started on 3/21 Zosyn discontinued on 3/23 and replaced with meropenem and nebulized tobramycin.   LINES/TUBES: Right IJ triple-lumen placed 3/21  DISCUSSION:      This is an 84 year old who appears to have a history of rheumatoid arthritis,  PE in the distant past,  and bipolar disorder who presented with a 2-day history of dyspnea and infiltrates on chest x-ray.  He has required intubation, mechanical ventilation and pressors.    PCR is positive for influenza A.  He also has positive cardiac enzymes without any acute changes on EKG and poor LV systolic function by echo with an EF of 25-30%.  ASSESSMENT / PLAN:  PULMONARY A: He has developed an ARDS picture.  Oxygenation is slowly improving but he is still pharmacologically paralyzed to maintain ventilator synchrony.  Plateau pressures remain a little higher than I would like to see, further decrease his tidal volume his blood gases allow. He continues Tamiflu with vancomycin, but Zosyn has been switched to meropenem plus inhaled tobramycin in the event that he is harboring a resistant gram-negative accounting for his persistent elevation of pro-calcitonin and lactate.   CARDIOVASCULAR A: Non-STEMI.  I added a statin 3/22 and discussed  his alleged aspirin allergy with family.  They tell me he was told not to take aspirin with Coumadin but does not have an allergy.  He is continuing on aspirin and a statin for his non-STEMI and if his LFTs look good in the morning the statin can be increased.  A repeat mixed venous saturation and lactate are pending this morning.  RENAL A: It appears he has a acute insult superimposed on his CKD with a rise in creatinine from 1.8-2.5.  This is remained unchanged overnight and he is beginning to have a little better urine output after fluid boluses yesterday evening.       GASTROINTESTINAL A: Prophylaxis is with Protonix     HEMATOLOGIC A: He was on full dose unfractionated heparin for his non-STEMI until the morning of 3/23.  The heparin was held due to a dropping platelet count.  The timing was a little early for hit and he certainly has other reasons to be thrombocytopenic however I am holding all heparinoid's until he hit antibody comes back.  His creatinine clearance at present is preventing me from utilizing Arixtra or a NOAC for DVT prophylaxis.    INFECTIOUS A: As noted above he is influenza A positive and continues on Tamiflu and is being covered for secondary infection pending culture results.  We still have no positive cultures back and repeat sputum culture was sent yesterday.  Because he has a persistent lactic acidosis despite what appears to be adequate perfusion as judged by his mixed venous saturation is gram-negative coverage was broadened yesterday to cover for potentially resistant secondary infections.  Zosyn was discontinued and he was started on tobramycin nebs and meropenem.    ENDOCRINE A: Insulin sliding scale insulin has been ordered     NEUROLOGIC A: He has a history of bipolar disorder, with potentially unstable renal function I am holding his lithium for now.    I have had extensive discussions with family about prognosis and explained to them that an 84 year old  who is been on  a ventilator for more than 2 days is going to face a daunting task of rehabilitation should he survive.  I will again be discussing the appropriate level of care with him today not so much emphasizing his poor prognosis for survival as his poor prognosis for rehabilitation.  Greater than 32 minutes was spent in the care of this  patient today.  Despite some subtle encouraging signs he remains critically ill with hemodynamic and respiratory failure requiring constant monitoring and interventions to prevent death.   Lars Masson, MD Pulmonary and [place] Pager: [phone]  06/16/2017, 7:19 AM

## 2017-06-16 NOTE — Progress Notes (Signed)
[facility] Progress Note Patient Name: Miguel [name] DOB: 01/16/1932 MRN: [id number]   Date of Service  06/16/2017  HPI/Events of Note  Low potassium and low calcium  eICU Interventions  replaced     Intervention Category Minor Interventions: Electrolytes abnormality - evaluation and management  Mauri Brooklyn, P 06/16/2017, 6:19 AM

## 2017-06-16 NOTE — Progress Notes (Signed)
While turning pt, right femoral A line was half way out.   MD notified.  Respiratory will try to put a radial A line in as pt is very edematous.  MD also made aware that pt's BP is trending down and RN is titrating medication.  New orders received.  Will continue to monitor.

## 2017-06-16 NOTE — Progress Notes (Signed)
Arterial Line placement attempted x2 by 2 RTs. All attempts unsuccessful. Will pass on to next shift.

## 2017-06-17 ENCOUNTER — Inpatient Hospital Stay (HOSPITAL_COMMUNITY): Payer: Medicare Other

## 2017-06-17 DIAGNOSIS — A419 Sepsis, unspecified organism: Secondary | ICD-10-CM

## 2017-06-17 LAB — CBC WITH DIFFERENTIAL/PLATELET
BAND NEUTROPHILS: 0 %
BASOS ABS: 0 10*3/uL (ref 0.0–0.1)
BASOS PCT: 0 %
Blasts: 0 %
EOS ABS: 0 10*3/uL (ref 0.0–0.7)
EOS PCT: 0 %
HEMATOCRIT: 21.9 % — AB (ref 39.0–52.0)
Hemoglobin: 7.3 g/dL — ABNORMAL LOW (ref 13.0–17.0)
LYMPHS ABS: 0.8 10*3/uL (ref 0.7–4.0)
LYMPHS PCT: 7 %
MCH: 30.4 pg (ref 26.0–34.0)
MCHC: 33.3 g/dL (ref 30.0–36.0)
MCV: 91.3 fL (ref 78.0–100.0)
MONOS PCT: 3 %
Metamyelocytes Relative: 0 %
Monocytes Absolute: 0.4 10*3/uL (ref 0.1–1.0)
Myelocytes: 0 %
Neutro Abs: 10.8 10*3/uL — ABNORMAL HIGH (ref 1.7–7.7)
Neutrophils Relative %: 90 %
Platelets: 90 10*3/uL — ABNORMAL LOW (ref 150–400)
Promyelocytes Absolute: 0 %
RBC: 2.4 MIL/uL — ABNORMAL LOW (ref 4.22–5.81)
RDW: 14.4 % (ref 11.5–15.5)
WBC: 12 10*3/uL — ABNORMAL HIGH (ref 4.0–10.5)
nRBC: 0 /100 WBC

## 2017-06-17 LAB — POCT I-STAT 3, ART BLOOD GAS (G3+)
Bicarbonate: 25.4 mmol/L (ref 20.0–28.0)
O2 Saturation: 98 %
PH ART: 7.386 (ref 7.350–7.450)
TCO2: 27 mmol/L (ref 22–32)
pCO2 arterial: 42.4 mmHg (ref 32.0–48.0)
pO2, Arterial: 103 mmHg (ref 83.0–108.0)

## 2017-06-17 LAB — GLUCOSE, CAPILLARY
GLUCOSE-CAPILLARY: 135 mg/dL — AB (ref 65–99)
Glucose-Capillary: 109 mg/dL — ABNORMAL HIGH (ref 65–99)
Glucose-Capillary: 120 mg/dL — ABNORMAL HIGH (ref 65–99)
Glucose-Capillary: 133 mg/dL — ABNORMAL HIGH (ref 65–99)
Glucose-Capillary: 144 mg/dL — ABNORMAL HIGH (ref 65–99)

## 2017-06-17 LAB — BLOOD GAS, ARTERIAL
Acid-Base Excess: 1 mmol/L (ref 0.0–2.0)
BICARBONATE: 25 mmol/L (ref 20.0–28.0)
Drawn by: 505221
FIO2: 60
LHR: 23 {breaths}/min
MECHVT: 470 mL
O2 Saturation: 95.8 %
PEEP: 12 cmH2O
PO2 ART: 82.5 mmHg — AB (ref 83.0–108.0)
Patient temperature: 98.6
pCO2 arterial: 39.6 mmHg (ref 32.0–48.0)
pH, Arterial: 7.417 (ref 7.350–7.450)

## 2017-06-17 LAB — COMPREHENSIVE METABOLIC PANEL
ALT: 112 U/L — ABNORMAL HIGH (ref 17–63)
ANION GAP: 12 (ref 5–15)
AST: 131 U/L — ABNORMAL HIGH (ref 15–41)
Albumin: 1.9 g/dL — ABNORMAL LOW (ref 3.5–5.0)
Alkaline Phosphatase: 92 U/L (ref 38–126)
BUN: 52 mg/dL — ABNORMAL HIGH (ref 6–20)
CHLORIDE: 93 mmol/L — AB (ref 101–111)
CO2: 25 mmol/L (ref 22–32)
Calcium: 6.6 mg/dL — ABNORMAL LOW (ref 8.9–10.3)
Creatinine, Ser: 2.32 mg/dL — ABNORMAL HIGH (ref 0.61–1.24)
GFR calc Af Amer: 28 mL/min — ABNORMAL LOW (ref >60)
GFR calc non Af Amer: 24 mL/min — ABNORMAL LOW (ref >60)
Glucose, Bld: 127 mg/dL — ABNORMAL HIGH (ref 65–99)
POTASSIUM: 3.2 mmol/L — AB (ref 3.5–5.1)
SODIUM: 130 mmol/L — AB (ref 135–145)
Total Bilirubin: 1.7 mg/dL — ABNORMAL HIGH (ref 0.3–1.2)
Total Protein: 4.8 g/dL — ABNORMAL LOW (ref 6.5–8.1)

## 2017-06-17 LAB — PHOSPHORUS: Phosphorus: 4.3 mg/dL (ref 2.5–4.6)

## 2017-06-17 LAB — HEMOGLOBIN AND HEMATOCRIT, BLOOD
HCT: 21.6 % — ABNORMAL LOW (ref 39.0–52.0)
Hemoglobin: 7.3 g/dL — ABNORMAL LOW (ref 13.0–17.0)

## 2017-06-17 LAB — CALCIUM, IONIZED: Calcium, Ionized, Serum: 3.5 mg/dL — ABNORMAL LOW (ref 4.5–5.6)

## 2017-06-17 LAB — MAGNESIUM: MAGNESIUM: 2.3 mg/dL (ref 1.7–2.4)

## 2017-06-17 LAB — PROCALCITONIN: PROCALCITONIN: 27.93 ng/mL

## 2017-06-17 LAB — LACTIC ACID, PLASMA: Lactic Acid, Venous: 1.6 mmol/L (ref 0.5–1.9)

## 2017-06-17 MED ORDER — SODIUM CHLORIDE 0.9 % IV SOLN
8.0000 mg/h | INTRAVENOUS | Status: DC
  Administered 2017-06-17 (×2): 8 mg/h via INTRAVENOUS
  Filled 2017-06-17 (×5): qty 80

## 2017-06-17 MED ORDER — POTASSIUM CHLORIDE 20 MEQ PO PACK
40.0000 meq | PACK | Freq: Once | ORAL | Status: AC
  Administered 2017-06-17: 40 meq via ORAL
  Filled 2017-06-17: qty 2

## 2017-06-17 MED ORDER — FENTANYL BOLUS VIA INFUSION
50.0000 ug | INTRAVENOUS | Status: DC | PRN
  Filled 2017-06-17: qty 200

## 2017-06-17 MED ORDER — PANTOPRAZOLE SODIUM 40 MG IV SOLR: 40.0000 mg | Freq: Two times a day (BID) | INTRAVENOUS | Status: DC

## 2017-06-17 MED ORDER — FENTANYL 2500MCG IN NS 250ML (10MCG/ML) PREMIX INFUSION
0.0000 ug/h | INTRAVENOUS | Status: DC
  Administered 2017-06-17: 200 ug/h via INTRAVENOUS

## 2017-06-17 MED ORDER — SODIUM CHLORIDE 0.9 % IV SOLN
80.0000 mg | Freq: Once | INTRAVENOUS | Status: AC
  Administered 2017-06-17: 11:00:00 80 mg via INTRAVENOUS
  Filled 2017-06-17: qty 80

## 2017-06-18 LAB — CULTURE, BLOOD (ROUTINE X 2)
CULTURE: NO GROWTH
Culture: NO GROWTH
Special Requests: ADEQUATE
Special Requests: ADEQUATE

## 2017-06-18 LAB — CULTURE, RESPIRATORY W GRAM STAIN
Culture: NO GROWTH
Special Requests: NORMAL

## 2017-06-18 LAB — CALCIUM, IONIZED: Calcium, Ionized, Serum: 3.9 mg/dL — ABNORMAL LOW (ref 4.5–5.6)

## 2017-06-18 LAB — CULTURE, RESPIRATORY

## 2017-06-19 ENCOUNTER — Telehealth: Payer: Self-pay

## 2017-06-19 LAB — SEROTONIN RELEASE ASSAY (SRA)
SRA .2 IU/mL UFH Ser-aCnc: 15 % (ref 0–20)
SRA 100IU/mL UFH Ser-aCnc: 15 % (ref 0–20)

## 2017-06-19 NOTE — Telephone Encounter (Signed)
On 06/19/17 I received a d/c from [facility] (original). The d/c is for burial. The patient is a patient of Doctor Marolyn Hammock. The d/c will be taken to Pulmonary Unit @ [facility] for signature.  On 06/21/17 I received the d/c back from Doctor Lake Bells who signed the d/c for Doctor Pearline Cables. I got the d/c ready and called the funeral home to let them know the d/c is ready for pickup.

## 2017-06-24 NOTE — Death Summary Note (Signed)
Miguel [name] was a 84 y.o. male admitted on 06/07/2017 with dyspnea, somnolence, fever, cough with hypoxia and wheezing.  Chest xray showed pneumonia.  He failed Bipap and required intubation.  He was started on antibiotics.  Had elevated lactic acid and troponin.  Found to have influenza A and started on tamiflu.  He developed ARDS.  Started on paralytics to improve oxygenation and synchronization with ventilator.  His medical condition continued to get worse.  Family agreed for DNR status and transition to comfort measures.  He was extubated on 06-29-17 and expired at 2003-07-04.  Cause of death: Acute hypoxic respiratory failure from influenza A H3 pneumonia and ARDS.  Final diagnoses: Septic shock NSTEMI from demand ischemia COPD Acute on chronic systolic congestive heart failure Atrial fibrillation on coumadin Acute renal failure with ATN Metabolic acidosis Lactic acidosis Anemia of critical illness Thrombocytopenia from sepsis with HIT ruled out Hyperglycemia Acute metabolic encephalopathy Relative adrenal insufficiency Hypokalemia Hypomagnesemia Hypocalcemia Rheumatoid arthritis on chronic prednisone therapy History of pulmonary embolism History of depression  [name], MD [facility] Pulmonary/Critical Care 06/20/2017, 10:39 AM

## 2017-06-24 NOTE — Progress Notes (Addendum)
PULMONARY / CRITICAL CARE MEDICINE   Name: ENDRIT GITTINS MRN: [id number] DOB: 06-24-1931     HISTORY OF PRESENT ILLNESS:        84 year old male with past medical history as below, which is significant for rheumatoid arthritis, pulmonary embolism in 2015 still on anticoagulation, and depression on several medications including lithium.  He was in his usual state of health until Sunday 3/17 when he was noted by family members to be increasingly somnolent.  This somewhat improved in the following days but he developed progressive shortness of breath with associated minimally productive cough and eventually fevers.  He presented to [facility] emergency department on 3/20 with these complaints.  Immediately upon arrival to the emergency department he was noted to be febrile and profoundly hypoxemic with oxygen saturations in the 60s on room air.  He was escalated to higher flows of submental oxygen and was eventually placed on BiPAP.  Chest radiography concerning for pneumonia.  He was started on broad-spectrum antibiotics.  No known sick contacts. Laboratory evaluation significant for serum creatinine 2.02, anion gap 17, serum bicarb 15, troponin 2.56, lactic acid 8.94, and BNP 734.9.  PCCM has been asked to admit  Pressor requirements increased throughout the day on 3/21 and he developed ventilator asynchrony requiring pharmacologic paralysis.   Paralytic therapy stopped the night of 3/24-3/25. On 23ug/min of levophed. Arterial line inadvertantly removed according to the nursing staff. Remains sedated with fentanyl and versed.   PAST MEDICAL HISTORY :  He  has a past medical history of Arthritis, Cancer ([place]), Clotting disorder ([place]), and Depression.  PAST SURGICAL HISTORY: He  has a past surgical history that includes Prostate surgery and Spine surgery.  Allergies  Allergen Reactions  . Ibuprofen   . Oxaprozin   . Phenelzine Sulfate     REACTION: if taken with certain foods or other  medications can cause an allergic reaction    No current facility-administered medications on file prior to encounter.    Current Outpatient Medications on File Prior to Encounter  Medication Sig  . acetaminophen (TYLENOL) 325 MG tablet Take by mouth every 6 (six) hours as needed for mild pain.  Marland Kitchen ALPRAZolam (XANAX) 0.25 MG tablet Take 0.25 mg by mouth at bedtime as needed for sleep.  . ARIPiprazole (ABILIFY) 5 MG tablet Take 2.5 mg by mouth every other day.   . Cholecalciferol (VITAMIN D3) 2000 UNITS TABS Take by mouth daily.  Marland Kitchen lithium carbonate (ESKALITH) 450 MG CR tablet Take 225 mg by mouth daily.  . Multiple Vitamin (MULTIVITAMIN) tablet Take 1 tablet by mouth daily.  . predniSONE (DELTASONE) 5 MG tablet TAKE 1 TABLET BY MOUTH WITH FOOD OR MILK EVERY DAY  . sertraline (ZOLOFT) 100 MG tablet Take 100 mg by mouth daily.  . traMADol (ULTRAM) 50 MG tablet Take 50 mg by mouth every 8 (eight) hours as needed for moderate pain.   Marland Kitchen warfarin (COUMADIN) 5 MG tablet Take by mouth. 5 MG daily except 2.5 MG on Mondays and Fridays.  [name] 11 MG TB24 Take 11 mg by mouth daily.    FAMILY HISTORY:  His has no family status information on file.    SOCIAL HISTORY: He  reports that he has quit smoking. He quit smokeless tobacco use about 60 years ago. He reports that he does not drink alcohol or use drugs.  REVIEW OF SYSTEMS:   Not obtainable  SUBJECTIVE:  Patient is sedated.  VITAL SIGNS: BP (!) 148/56  Pulse (!) 56   Temp 99.3 F (37.4 C)   Resp (!) 28   Ht 5\' 7"  (1.702 m)   Wt 118.9 kg (262 lb 2 oz)   SpO2 93%   BMI 41.05 kg/m   HEMODYNAMICS:    VENTILATOR SETTINGS: Vent Mode: PRVC FiO2 (%):  [60 %-70 %] 70 % Set Rate:  [28 bmp] 28 bmp Vt Set:  [400 mL] 400 mL PEEP:  [12 cmH20] 12 cmH20 Plateau Pressure:  [30 cmH20-35 cmH20] 34 cmH20  INTAKE / OUTPUT: I/O last 3 completed shifts: In: 6279.1 [I.V.:4009.1; NG/GT:720; IV Piggyback:1550] Out: 2800  [Urine:2800]  PHYSICAL EXAMINATION: General: Intubated/sedated. No distress observed.       HEENT: PERL. No icterus. Right IJ catheter.  Cardiovascular: s1s2, regular rhythm. No murmur, gallop or rub. Abdomen: obese, soft. No organomegaly or mass. No elicited tenderness. Lungs: Bilateral breath sounds. No wheezes, crackles. Bilateral rhonchi. Ext: +1 anasarca. No cyanosis. +1 peripheral pulses. Neuro: RASS -3.    LABS:  BMET Recent Labs  Lab 06/15/17 0449 06/16/17 0445 2017-07-03 0442  NA 126* 127* 130*  K 3.5 3.3* 3.2*  CL 87* 91* 93*  CO2 24 23 25   BUN 35* 43* 52*  CREATININE 2.34* 2.35* 2.32*  GLUCOSE 170* 158* 127*    Electrolytes Recent Labs  Lab 06/13/17 0534  06/14/17 1525 06/15/17 0449 06/16/17 0445 03-Jul-2017 0442  CALCIUM 7.2*   < > 5.9* 5.8* 6.0* 6.6*  MG 1.5*   < > 1.5* 1.8  --  2.3  PHOS 2.1*  --   --  4.4  --  4.3   < > = values in this interval not displayed.    CBC Recent Labs  Lab 06/16/17 0445 06/16/17 1520 2017/07/03 0442  WBC 13.9* 12.0* 12.0*  HGB 7.8* 7.6* 7.3*  HCT 23.3* 22.4* 21.9*  PLT 97* 85* 90*    Coag's Recent Labs  Lab 05/26/2017 2255  INR 1.57    Sepsis Markers Recent Labs  Lab 06/15/17 0449  06/16/17 0651 06/16/17 1011 July 03, 2017 0442  LATICACIDVEN 4.2*   < > 1.8 1.7 1.6  PROCALCITON 70.20  --  42.85  --  27.93   < > = values in this interval not displayed.    ABG Recent Labs  Lab 06/16/17 0344 06/16/17 0755 07-03-2017 0327  PHART 7.372 7.417 7.386  PCO2ART 43.8 39.6 42.4  PO2ART 78.0* 82.5* 103.0    Liver Enzymes Recent Labs  Lab 06/14/17 0500 06/16/17 0445 2017/07/03 0442  AST 127* 193* 131*  ALT 65* 134* 112*  ALKPHOS 21* 45 92  BILITOT 0.9 1.8* 1.7*  ALBUMIN 2.4* 1.8* 1.9*    Cardiac Enzymes Recent Labs  Lab 06/13/17 0828 06/13/17 1630 06/13/17 2306  TROPONINI 16.58* 12.75* 13.08*    Glucose Recent Labs  Lab 06/16/17 1136 06/16/17 1535 06/16/17 2011 03-Jul-2017 0018 07-03-17 0346  07/03/2017 0802  GLUCAP 131* 132* 120* 109* 133* 120*    Imaging Dg Chest Port 1 View  Result Date: July 03, 2017 CLINICAL DATA:  Hypoxia EXAM: PORTABLE CHEST 1 VIEW COMPARISON:  Portable exam 0551 hours compared to 06/16/2017 FINDINGS: Tip of endotracheal tube projects 2.2 cm above carina. Nasogastric tube extends into stomach. RIGHT jugular central venous catheter with tip projecting over proximal SVC. Enlargement of cardiac silhouette with pulmonary vascular congestion. Diffuse BILATERAL pulmonary infiltrates greater on LEFT, question asymmetric edema versus multifocal pneumonia. No gross pleural effusion or pneumothorax. IMPRESSION: Persistent severe diffuse BILATERAL airspace infiltrates greater on LEFT question asymmetric edema versus multifocal  pneumonia. Electronically Signed   By: Lavonia Dana M.D.   On: 07/15/2017 08:08     STUDIES:  PCR is positive for influenza A. MRSA PCR negative.  CULTURES: Cultures from admission remain negative. Sputum gram stain shows no organisms. ANTIBIOTICS: Vancomycin Zosyn and Tamiflu started on 3/21 Zosyn discontinued on 3/23 and replaced with meropenem and nebulized tobramycin. Remains on Vancomycin.   LINES/TUBES: Right IJ triple-lumen placed 3/21  DISCUSSION:      This is an 84 year old who appears to have a history of rheumatoid arthritis,  PE in the distant past,  and bipolar disorder who presented with a 2-day history of dyspnea and infiltrates on chest x-ray.  He has required intubation, mechanical ventilation and pressors.    PCR is positive for influenza A.  He also has positive cardiac enzymes without any acute changes on EKG and poor LV systolic function by echo with an EF of 25-30%.  ASSESSMENT / PLAN:  PULMONARY A: He has developed an ARDS picture.  Oxygenation is slowly improving. No ventilator asynchrony observed today off Nimbex.  He continues Tamiflu with vancomycin, but Zosyn has been switched to meropenem plus inhaled tobramycin in  the event that he is harboring a resistant gram-negative accounting for his persistent elevation of pro-calcitonin and lactate.  Wean fio2 as tolerated to maintain spo2 > 90%. Keep P plateau < 30cm. Vt at 6-8 ml/kg/ibw.  CARDIOVASCULAR A: Non-STEMI.  I added a statin 3/22 and discussed his alleged aspirin allergy with family.  They tell me he was told not to take aspirin with Coumadin but does not have an allergy.  Will hold asa due to guaiac positive stools. Serum lactate is normal. Will stop iv fluids due to the development of a +fluid balance and anasarca. Wean levophed to maintain map > 53mmHg.  RENAL A: It appears he has a acute insult superimposed on his CKD with a rise in creatinine from 1.8-2.5.  This is remained unchanged overnight and he is beginning to have a little better urine output after fluid boluses yesterday evening.   Will give 42meq of enteral potassium today for mild hypokalemia. F/u bmp in the am.     GASTROINTESTINAL A: Nurse reports guaiac positive stool. Will start continuous infusion of protonix and hold asa. Monitor H/H. May need EGD.   HEMATOLOGIC A: He was on full dose unfractionated heparin for his non-STEMI until the morning of 3/23.  The heparin was held due to a dropping platelet count.  The timing was a little early for hit and he certainly has other reasons to be thrombocytopenic however I am holding all heparinoid's until he hit antibody comes back.  His creatinine clearance at present is preventing me from utilizing Arixtra or a NOAC for DVT prophylaxis.  HIT Ab level normal. IV heparin will continue to be held given the development of guaiac + stools.  INFECTIOUS A: As noted above he is influenza A positive and continues on Tamiflu and is being covered for secondary infection pending culture results.  We still have no positive cultures back and repeat sputum culture was sent yesterday.  Gram negative coverage was broadened yesterday to cover for potentially  resistant secondary infections.  Zosyn was discontinued and he was started on tobramycin nebs and meropenem.    ENDOCRINE A: Insulin sliding scale insulin has been ordered     NEUROLOGIC A: He has a history of bipolar disorder. Receiving iv sedation.  Greater than 40 minutes was spent in the care of this  patient today.  Despite some subtle encouraging signs he remains critically ill with hemodynamic and respiratory failure requiring constant monitoring and interventions to prevent death.   Salley Scarlet, MD Pulmonary and [place] Pager: [phone]  Addendum:  Patient's family, including wife and their children, discussed his advance directives with me today. They state he would not want to remain on life support given his current state of health. All available findings regarding his diagnosis and treatment were discussed. They wish to withdrawal life support and provide comfort measures. Orders were written for comfort measures. All questions were answered.

## 2017-06-24 NOTE — Progress Notes (Signed)
Pt extubated to comfort care @ 1955. Family at bedside (wife- Izora Gala, daughter & son). Asystole on monitor. Pronounced TOD @2005  by [place] Isidora Laham, RN & Matilde Haymaker, RN.  CDS notified @ 2006. Dr Emmit Alexanders @ [facility] notified of TOD.  Medical Examiner Jeanette Caprice notified and pt is not an ME case.  Pt belongings returned to wife Izora Gala.

## 2017-06-24 NOTE — Procedures (Signed)
Extubation Procedure Note  Patient Details:   Name: Miguel [name] DOB: 29-Nov-1931 MRN: [id number]   Airway Documentation:  Patient terminally extubated per order.  Evaluation  O2 sats: currently acceptable Complications: No apparent complications Patient did tolerate procedure well. Bilateral Breath Sounds: Clear, Diminished   No  Montee Tallman, Elwyn Lade 2017-07-09, 7:57 PM

## 2017-06-24 NOTE — Progress Notes (Signed)
Wasted 152mL of fentanyl and 19mL of versed down drain with Phebe Colla, RN.

## 2017-06-24 NOTE — Progress Notes (Addendum)
ANTIBIOTIC CONSULT NOTE - Follow Up Consult   Allergies  Allergen Reactions  . Ibuprofen   . Oxaprozin   . Phenelzine Sulfate     REACTION: if taken with certain foods or other medications can cause an allergic reaction    Patient Measurements: Height: 5\' 7"  (170.2 cm) Weight: 262 lb 2 oz (118.9 kg) IBW/kg (Calculated) : 66.1 Heparin Dosing Weight: 85 kg  Vital Signs: Temp: 99.3 F (37.4 C) (03/25 0900) Temp Source: Core (03/25 0000) BP: 148/56 (03/25 0900) Pulse Rate: 56 (03/25 0900)  Labs: Recent Labs    06/15/17 0449 06/16/17 0445 06/16/17 1520 June 19, 2017 0442  HGB 8.3* 7.8* 7.6* 7.3*  HCT 24.6* 23.3* 22.4* 21.9*  PLT 109* 97* 85* 90*  HEPARINUNFRC 0.35  --   --   --   CREATININE 2.34* 2.35*  --  2.32*    Estimated Creatinine Clearance: 29.8 mL/min (A) (by C-G formula based on SCr of 2.32 mg/dL (H)).   Assessment: 83 y.o. male admitted with AMS/SOB. On warfarin for h/o PE. Pharmacy managing  vancomycin and zosyn for r/o pneumonia.   ID: WBC 17.8>14>12.0., Tmax 24 hours afebrile, SCr2.32. Urine output improved  Plan:  -Continue vancomycin to 1500 mg IV Q 48 hours  -will order vancomycin level prior to tonight's dose -Continue Zosyn 3.375 gm IV Q 8 hours (EI infusion) -Monitor renal fx, cultures and clinical progress -VT at [place], PharmD Clinical Pharmacist 06-19-17 9:29 AM

## 2017-06-24 DEATH — deceased

## 2017-07-26 ENCOUNTER — Other Ambulatory Visit: Payer: Medicare Other

## 2017-07-26 ENCOUNTER — Ambulatory Visit: Payer: Medicare Other | Admitting: Hematology

## 2018-04-17 IMAGING — DX DG CHEST 1V PORT
1 series · 1 of 1 positions shown · non-contrast
Comparison: 06/13/2017

CLINICAL DATA: Acute respiratory failure

EXAM:
PORTABLE CHEST 1 VIEW

[chest ap]
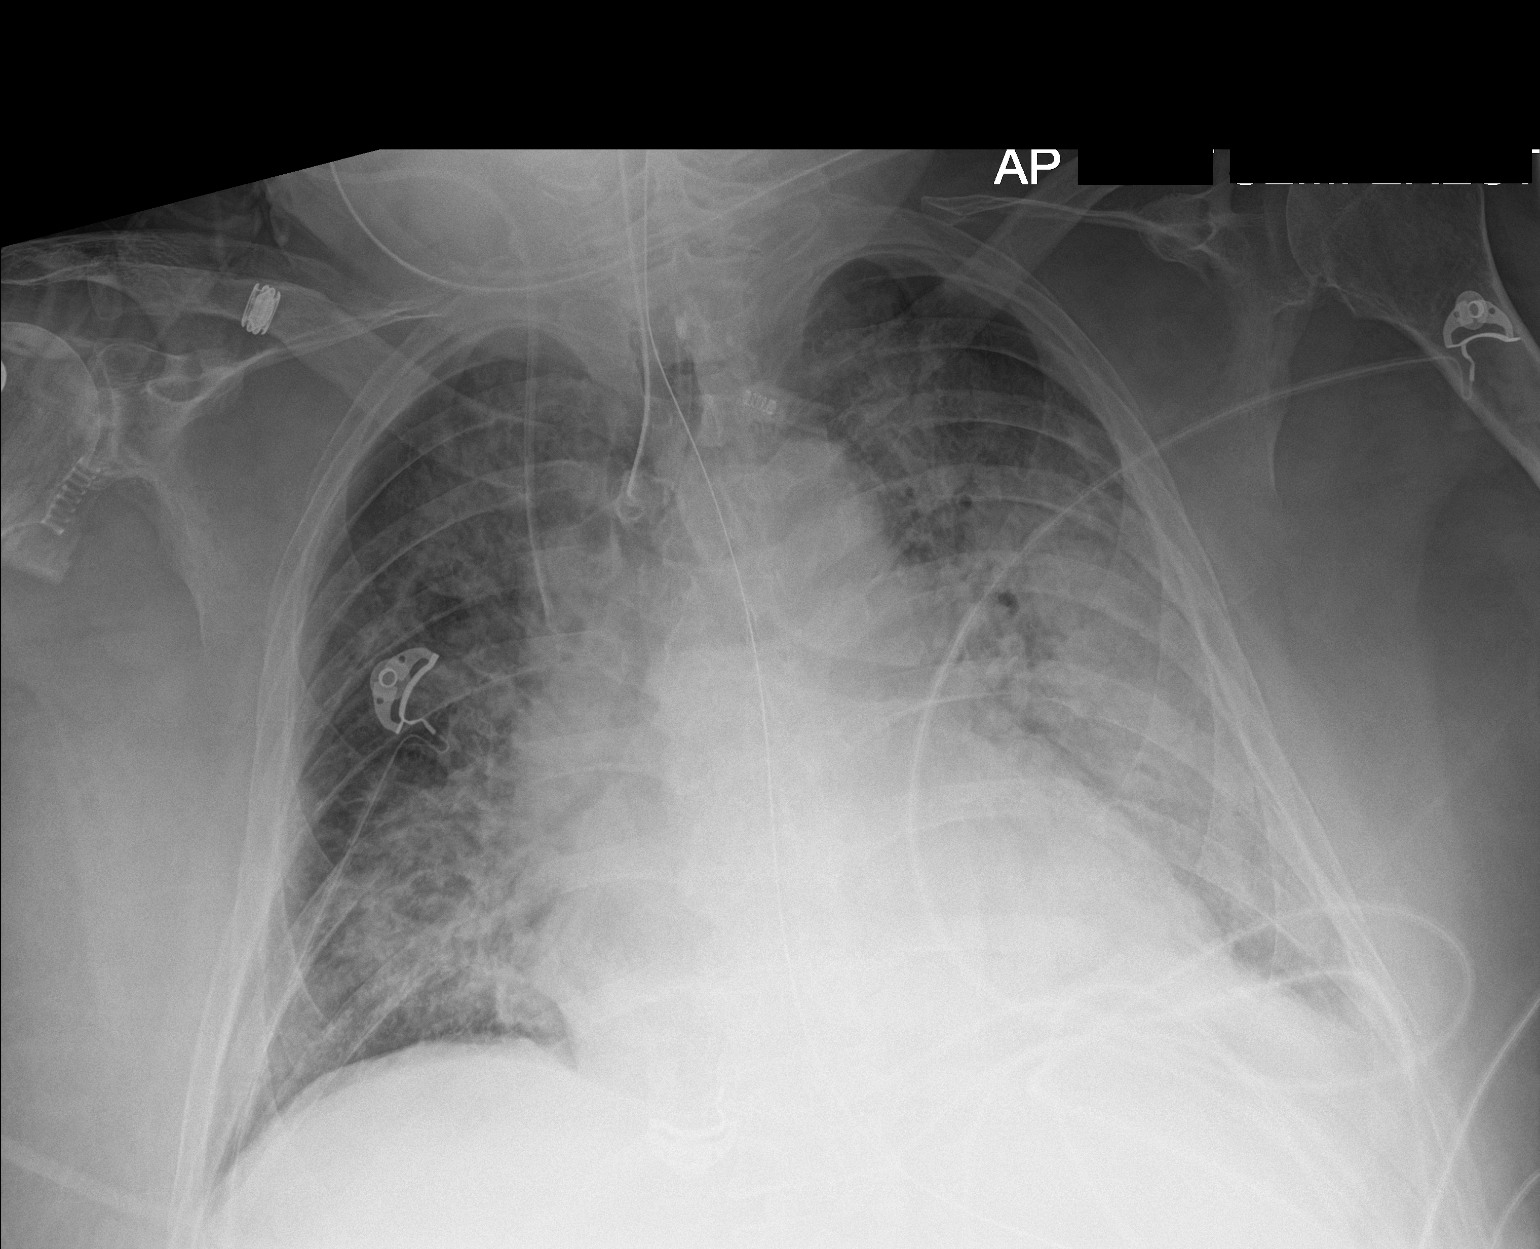

[1 of 1 positions shown; findings below may reference images not displayed]

FINDINGS: Cardiac shadow is enlarged. Endotracheal tube, nasogastric catheter
and right jugular central line are again seen and stable. Diffuse
increased perihilar markings are noted and stable from the prior
exam. No new focal abnormality is seen.
IMPRESSION: Stable appearance when compared with the prior exam with diffuse
increased perihilar markings. This again likely represents a
combination of infiltrate and edema.

## 2018-04-19 IMAGING — DX DG CHEST 1V PORT
1 series · 1 of 1 positions shown · non-contrast
Comparison: 06/14/2017

CLINICAL DATA: 85-year-old with hypoxia.

EXAM:
PORTABLE CHEST 1 VIEW

[chest ap]
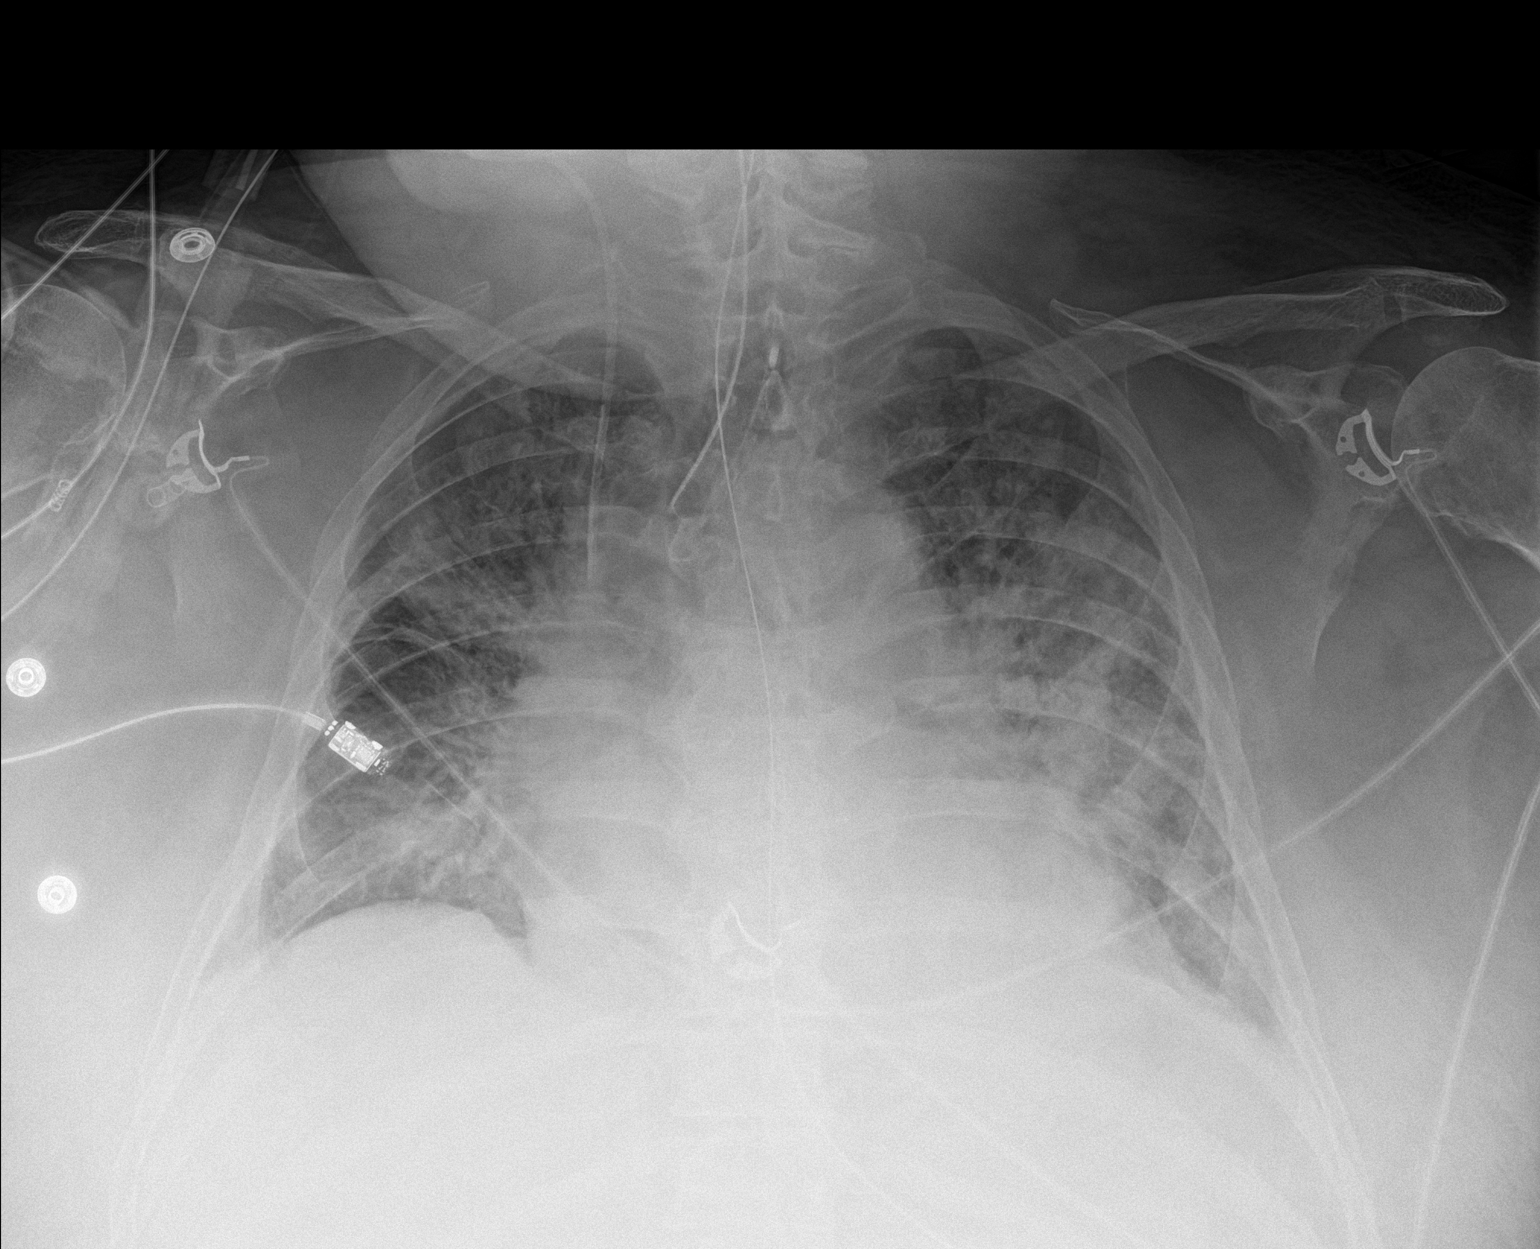

[1 of 1 positions shown; findings below may reference images not displayed]

FINDINGS: Endotracheal tube is 2.8 cm above the carina. Nasogastric tube
extends into the abdomen. Right jugular central line in the upper
SVC region. Bilateral airspace densities with slightly improved
aeration in the mid left lung. Cardiomediastinal silhouette remains
prominent for size. Negative for a pneumothorax.
IMPRESSION: Persistent bilateral airspace disease with mildly improved aeration
in the left lung. Findings could represent pulmonary edema and/or
infection.

Support apparatuses as described.

## 2018-04-20 IMAGING — DX DG ABDOMEN 1V
1 series · 1 of 1 positions shown · non-contrast
Comparison: 06/17/2017

CLINICAL DATA: OG tube placement

EXAM:
ABDOMEN - 1 VIEW

[abdomen]
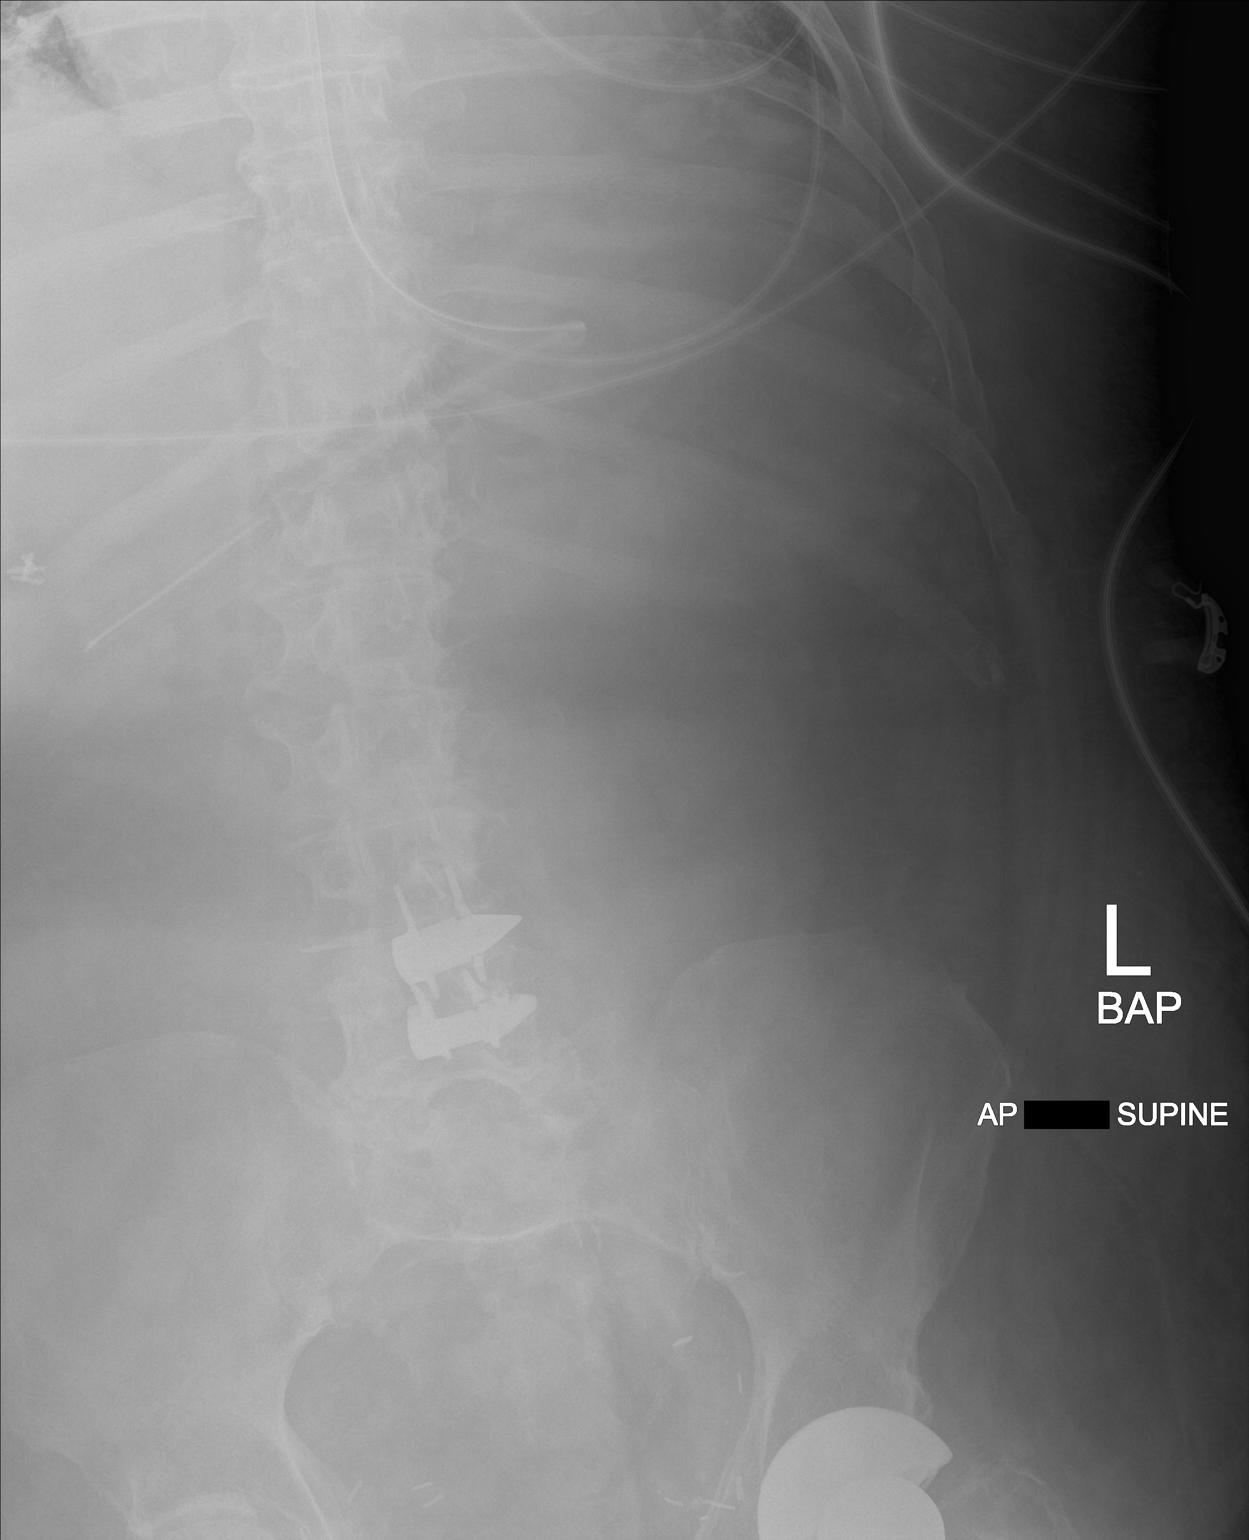

[1 of 1 positions shown; findings below may reference images not displayed]

FINDINGS: OG tube tip is in the distal stomach. Relative paucity of gas
throughout the abdomen.
IMPRESSION: OG tube tip in the distal stomach.

## 2018-04-20 IMAGING — DX DG ABD PORTABLE 1V
1 series · 1 of 1 positions shown · non-contrast
Comparison: None in PACs

CLINICAL DATA: Confirm feeding tube position.

EXAM:
PORTABLE ABDOMEN - 1 VIEW

[abdomen]
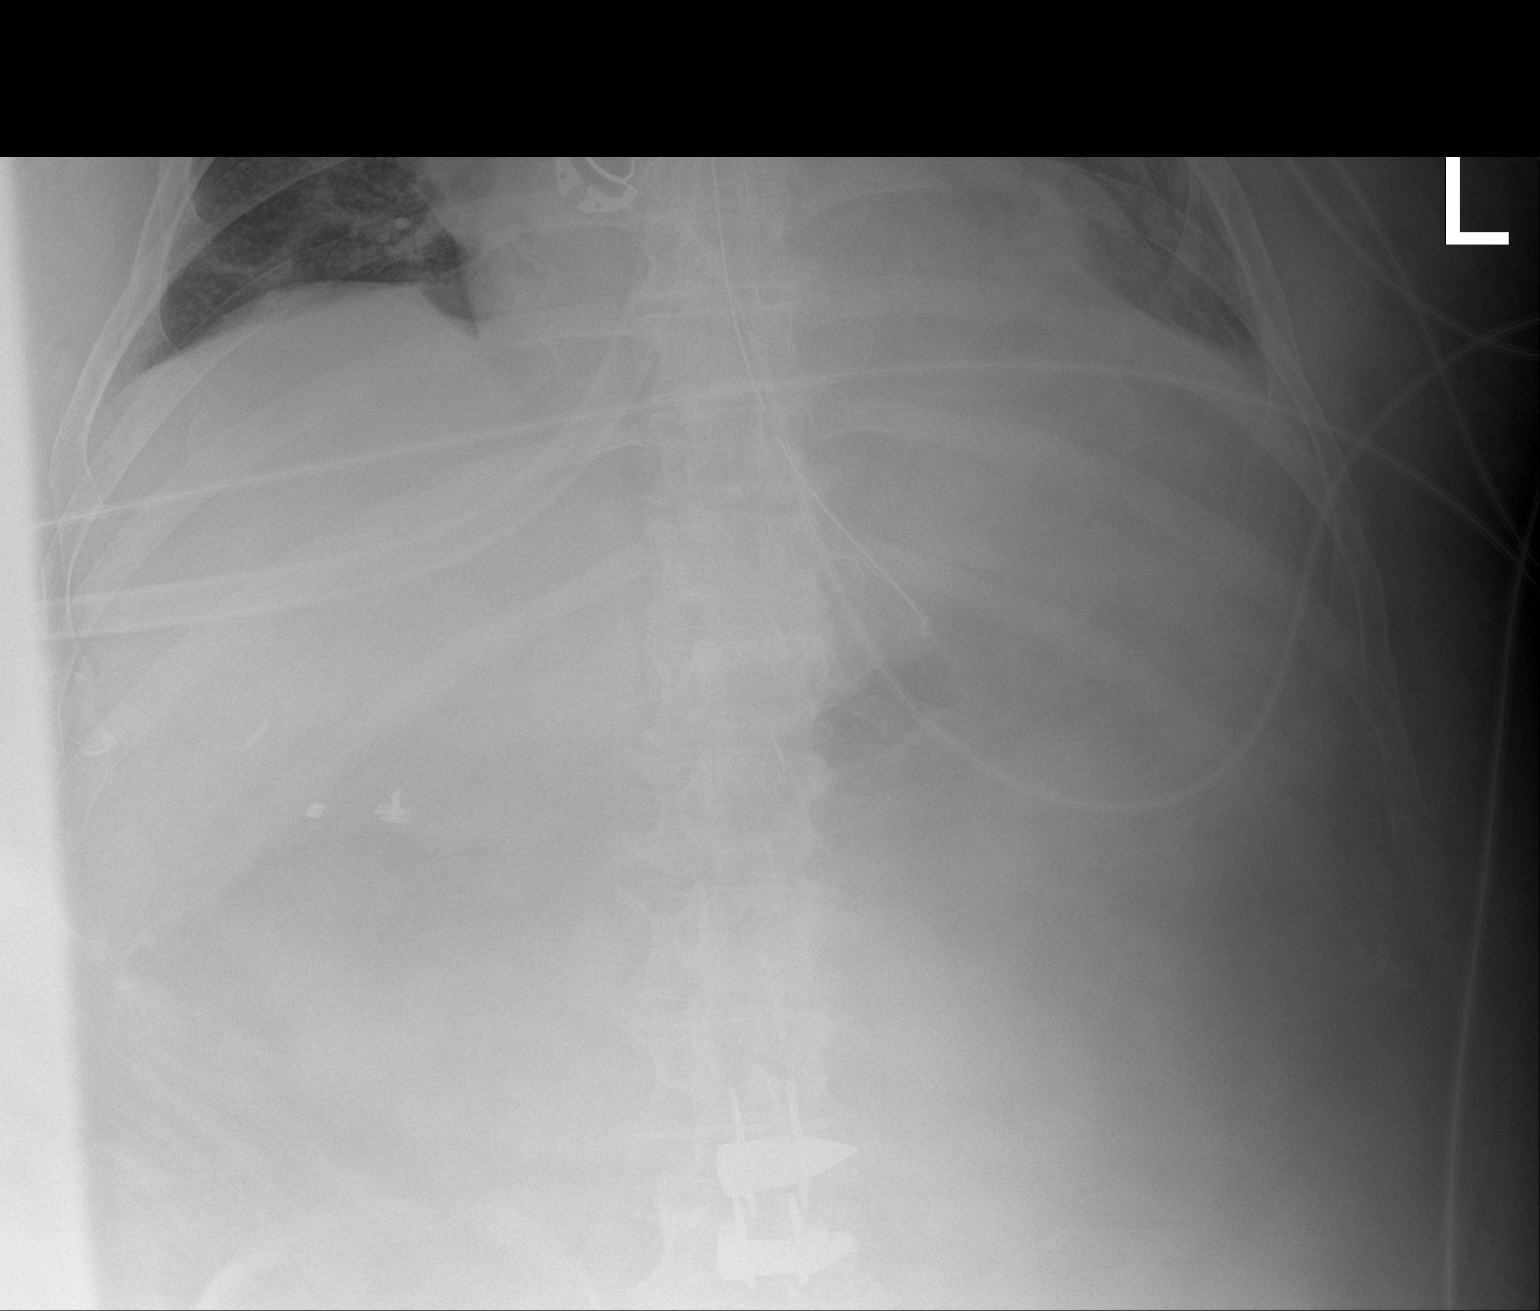

[1 of 1 positions shown; findings below may reference images not displayed]

FINDINGS: The proximal port of the esophagogastric tube lies at or just above
the GE junction with the tip in the gastric cardia. Advancement is
needed. There is atelectasis at the left lung base.
IMPRESSION: High positioning of the esophagogastric tube. Advancement by
approximately 10 cm is recommended to assure that the proximal port
lies below the GE junction.

## 2018-04-20 IMAGING — DX DG CHEST 1V PORT
1 series · 1 of 1 positions shown · non-contrast
Comparison: Portable exam 7668 hours compared to 06/16/2017

CLINICAL DATA: Hypoxia

EXAM:
PORTABLE CHEST 1 VIEW

[chest ap]
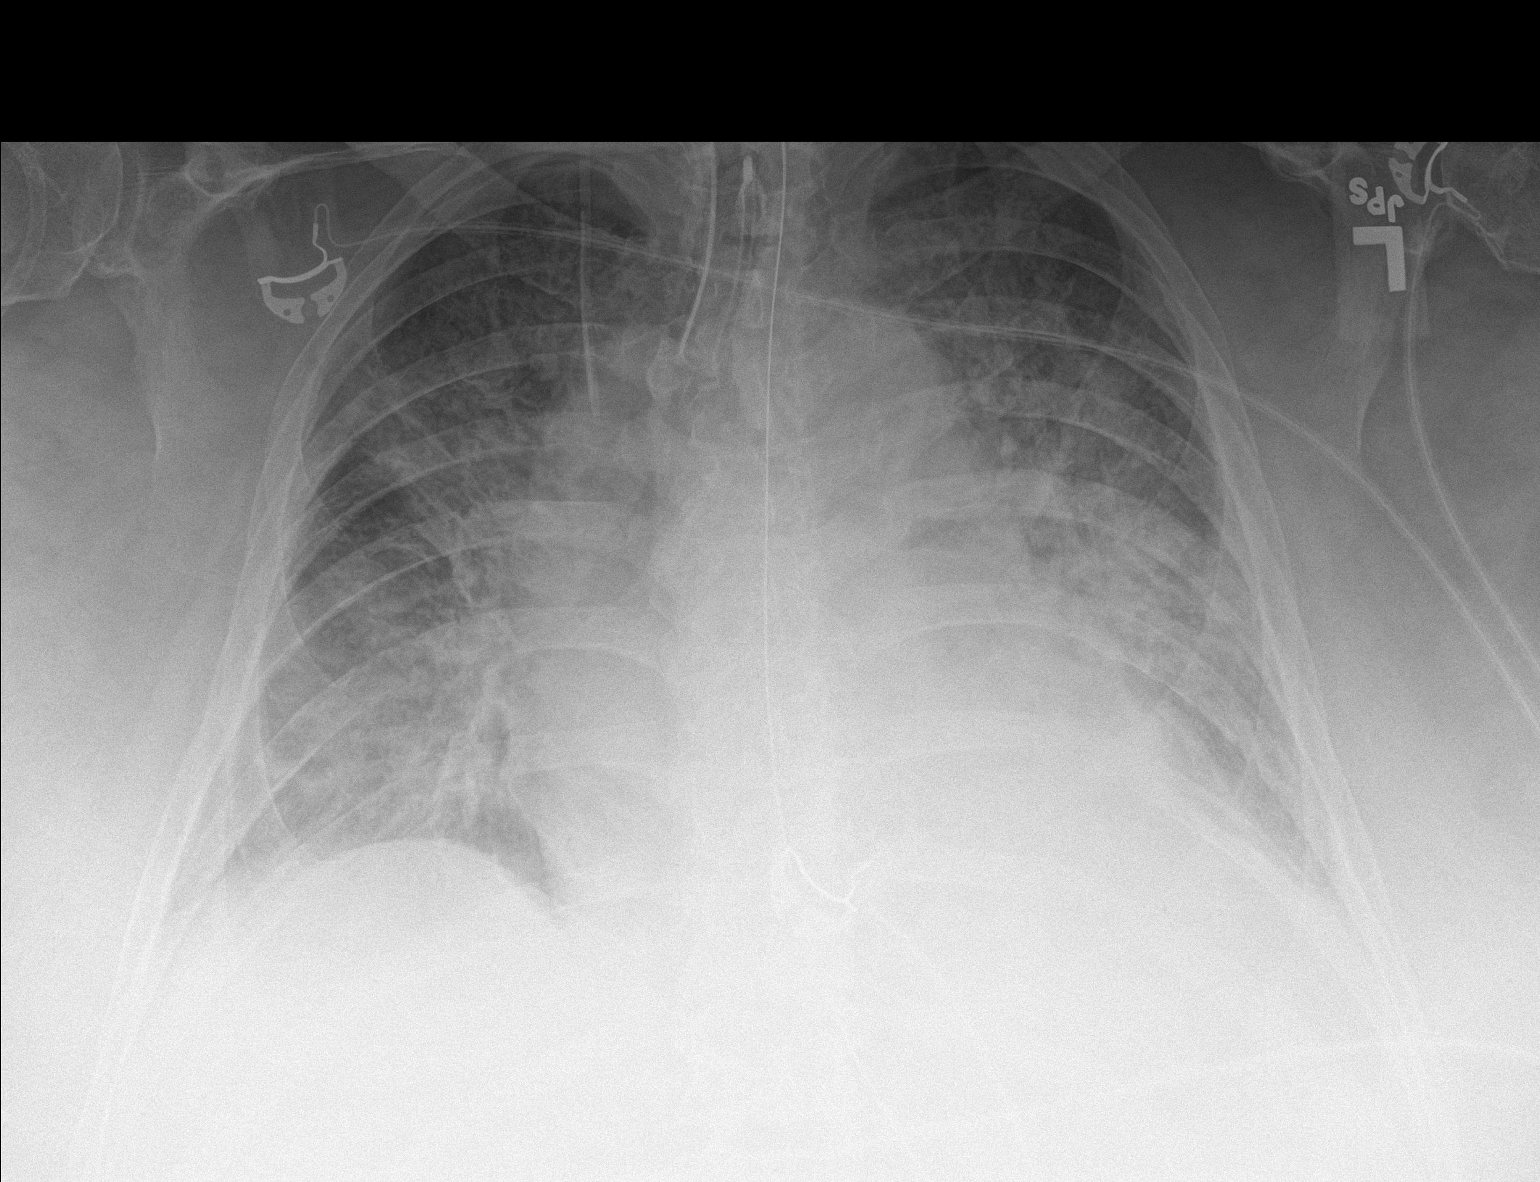

[1 of 1 positions shown; findings below may reference images not displayed]

FINDINGS: Tip of endotracheal tube projects 2.2 cm above carina.

Nasogastric tube extends into stomach.

RIGHT jugular central venous catheter with tip projecting over
proximal SVC.

Enlargement of cardiac silhouette with pulmonary vascular
congestion.

Diffuse BILATERAL pulmonary infiltrates greater on LEFT, question
asymmetric edema versus multifocal pneumonia.

No gross pleural effusion or pneumothorax.
IMPRESSION: Persistent severe diffuse BILATERAL airspace infiltrates greater on
LEFT question asymmetric edema versus multifocal pneumonia.
